# Patient Record
Sex: Male | Born: 1955
Health system: Southern US, Community
[De-identification: ages and names within clinical notes are randomized; demographics above are authoritative.]

## PROBLEM LIST (undated history)

## (undated) DIAGNOSIS — I10 Essential (primary) hypertension: Secondary | ICD-10-CM

## (undated) DIAGNOSIS — E785 Hyperlipidemia, unspecified: Secondary | ICD-10-CM

## (undated) DIAGNOSIS — H269 Unspecified cataract: Secondary | ICD-10-CM

## (undated) DIAGNOSIS — T7840XA Allergy, unspecified, initial encounter: Secondary | ICD-10-CM

## (undated) DIAGNOSIS — G4730 Sleep apnea, unspecified: Secondary | ICD-10-CM

## (undated) HISTORY — PX: DENTAL SURGERY: SHX609

## (undated) HISTORY — PX: OTHER SURGICAL HISTORY: SHX169

## (undated) HISTORY — DX: Allergy, unspecified, initial encounter: T78.40XA

## (undated) HISTORY — DX: Hyperlipidemia, unspecified: E78.5

## (undated) HISTORY — PX: APPENDECTOMY: SHX54

## (undated) HISTORY — DX: Essential (primary) hypertension: I10

## (undated) HISTORY — PX: COLONOSCOPY: SHX174

## (undated) HISTORY — DX: Unspecified cataract: H26.9

## (undated) HISTORY — DX: Sleep apnea, unspecified: G47.30

---

## 2001-01-05 HISTORY — PX: SINUS IRRIGATION: SHX2411

## 2003-05-20 ENCOUNTER — Emergency Department (HOSPITAL_COMMUNITY): Admission: EM | Admit: 2003-05-20 | Discharge: 2003-05-20 | Payer: Self-pay | Admitting: *Deleted

## 2003-08-19 ENCOUNTER — Emergency Department (HOSPITAL_COMMUNITY): Admission: EM | Admit: 2003-08-19 | Discharge: 2003-08-19 | Payer: Self-pay | Admitting: Family Medicine

## 2003-11-21 ENCOUNTER — Ambulatory Visit: Payer: Self-pay | Admitting: Internal Medicine

## 2004-12-30 ENCOUNTER — Ambulatory Visit: Payer: Self-pay | Admitting: Internal Medicine

## 2005-01-14 ENCOUNTER — Ambulatory Visit: Payer: Self-pay | Admitting: Internal Medicine

## 2005-01-19 ENCOUNTER — Ambulatory Visit: Payer: Self-pay | Admitting: Internal Medicine

## 2005-02-11 ENCOUNTER — Ambulatory Visit: Payer: Self-pay | Admitting: Internal Medicine

## 2005-02-18 ENCOUNTER — Ambulatory Visit: Payer: Self-pay | Admitting: Internal Medicine

## 2005-02-27 ENCOUNTER — Ambulatory Visit: Payer: Self-pay

## 2005-12-01 ENCOUNTER — Ambulatory Visit: Payer: Self-pay | Admitting: Internal Medicine

## 2006-07-29 DIAGNOSIS — E785 Hyperlipidemia, unspecified: Secondary | ICD-10-CM | POA: Insufficient documentation

## 2006-08-27 ENCOUNTER — Ambulatory Visit: Payer: Self-pay | Admitting: Internal Medicine

## 2006-08-27 LAB — CONVERTED CEMR LAB
ALT: 31 units/L (ref 0–53)
AST: 24 units/L (ref 0–37)
Albumin: 4.2 g/dL (ref 3.5–5.2)
Alkaline Phosphatase: 44 units/L (ref 39–117)
BUN: 14 mg/dL (ref 6–23)
Basophils Absolute: 0 10*3/uL (ref 0.0–0.1)
Basophils Relative: 0.3 % (ref 0.0–1.0)
Bilirubin Urine: NEGATIVE
Bilirubin, Direct: 0.1 mg/dL (ref 0.0–0.3)
Blood in Urine, dipstick: NEGATIVE
CO2: 27 meq/L (ref 19–32)
Calcium: 9.1 mg/dL (ref 8.4–10.5)
Chloride: 107 meq/L (ref 96–112)
Cholesterol: 193 mg/dL (ref 0–200)
Creatinine, Ser: 0.9 mg/dL (ref 0.4–1.5)
Eosinophils Absolute: 0.2 10*3/uL (ref 0.0–0.6)
Eosinophils Relative: 4 % (ref 0.0–5.0)
GFR calc Af Amer: 114 mL/min
GFR calc non Af Amer: 94 mL/min
Glucose, Bld: 96 mg/dL (ref 70–99)
Glucose, Urine, Semiquant: NEGATIVE
HCT: 41 % (ref 39.0–52.0)
HDL: 59.4 mg/dL (ref >39.0)
Hemoglobin: 14.3 g/dL (ref 13.0–17.0)
Ketones, urine, test strip: NEGATIVE
LDL Cholesterol: 122 mg/dL — ABNORMAL HIGH (ref 0–99)
Lymphocytes Relative: 27.6 % (ref 12.0–46.0)
MCHC: 34.9 g/dL (ref 30.0–36.0)
MCV: 92.8 fL (ref 78.0–100.0)
Monocytes Absolute: 0.5 10*3/uL (ref 0.2–0.7)
Monocytes Relative: 9.8 % (ref 3.0–11.0)
Neutro Abs: 2.9 10*3/uL (ref 1.4–7.7)
Neutrophils Relative %: 58.3 % (ref 43.0–77.0)
Nitrite: NEGATIVE
PSA: 1.19 ng/mL (ref 0.10–4.00)
Platelets: 239 10*3/uL (ref 150–400)
Potassium: 4.7 meq/L (ref 3.5–5.1)
Protein, U semiquant: NEGATIVE
RBC: 4.42 M/uL (ref 4.22–5.81)
RDW: 12.8 % (ref 11.5–14.6)
Sodium: 142 meq/L (ref 135–145)
Specific Gravity, Urine: 1.025
TSH: 0.77 microintl units/mL (ref 0.35–5.50)
Total Bilirubin: 1.1 mg/dL (ref 0.3–1.2)
Total CHOL/HDL Ratio: 3.2
Total Protein: 6.5 g/dL (ref 6.0–8.3)
Triglycerides: 56 mg/dL (ref 0–149)
Urobilinogen, UA: 0.2
VLDL: 11 mg/dL (ref 0–40)
WBC Urine, dipstick: NEGATIVE
WBC: 5 10*3/uL (ref 4.5–10.5)
pH: 6.5

## 2006-09-03 ENCOUNTER — Ambulatory Visit: Payer: Self-pay | Admitting: Internal Medicine

## 2006-12-07 ENCOUNTER — Ambulatory Visit: Payer: Self-pay | Admitting: Internal Medicine

## 2007-03-25 ENCOUNTER — Ambulatory Visit: Payer: Self-pay | Admitting: Internal Medicine

## 2007-03-25 DIAGNOSIS — I10 Essential (primary) hypertension: Secondary | ICD-10-CM | POA: Insufficient documentation

## 2007-04-27 ENCOUNTER — Ambulatory Visit: Payer: Self-pay | Admitting: Internal Medicine

## 2007-05-18 ENCOUNTER — Ambulatory Visit: Payer: Self-pay | Admitting: Internal Medicine

## 2007-05-18 LAB — CONVERTED CEMR LAB
AST: 27 units/L (ref 0–37)
Albumin: 4.1 g/dL (ref 3.5–5.2)
BUN: 13 mg/dL (ref 6–23)
Calcium: 9.3 mg/dL (ref 8.4–10.5)
Cholesterol: 152 mg/dL (ref 0–200)
Creatinine, Ser: 0.9 mg/dL (ref 0.4–1.5)
GFR calc Af Amer: 114 mL/min
GFR calc non Af Amer: 94 mL/min
Glucose, Bld: 91 mg/dL (ref 70–99)
HDL: 47.8 mg/dL (ref >39.0)
Total CHOL/HDL Ratio: 3.2
VLDL: 15 mg/dL (ref 0–40)

## 2007-05-25 ENCOUNTER — Ambulatory Visit: Payer: Self-pay | Admitting: Internal Medicine

## 2007-12-07 ENCOUNTER — Ambulatory Visit: Payer: Self-pay | Admitting: Internal Medicine

## 2007-12-07 LAB — CONVERTED CEMR LAB
AST: 25 units/L (ref 0–37)
Alkaline Phosphatase: 40 units/L (ref 39–117)
Bilirubin Urine: NEGATIVE
Blood in Urine, dipstick: NEGATIVE
CO2: 29 meq/L (ref 19–32)
Chloride: 110 meq/L (ref 96–112)
Cholesterol: 130 mg/dL (ref 0–200)
Creatinine, Ser: 1 mg/dL (ref 0.4–1.5)
Eosinophils Absolute: 0.3 10*3/uL (ref 0.0–0.7)
Eosinophils Relative: 4.6 % (ref 0.0–5.0)
GFR calc non Af Amer: 83 mL/min
Glucose, Urine, Semiquant: NEGATIVE
Hemoglobin: 14 g/dL (ref 13.0–17.0)
LDL Cholesterol: 70 mg/dL (ref 0–99)
MCHC: 34.3 g/dL (ref 30.0–36.0)
MCV: 93.4 fL (ref 78.0–100.0)
Monocytes Absolute: 0.7 10*3/uL (ref 0.1–1.0)
Monocytes Relative: 10.9 % (ref 3.0–12.0)
Neutrophils Relative %: 55.3 % (ref 43.0–77.0)
Platelets: 220 10*3/uL (ref 150–400)
Potassium: 4.9 meq/L (ref 3.5–5.1)
Protein, U semiquant: NEGATIVE
RBC: 4.37 M/uL (ref 4.22–5.81)
RDW: 12.7 % (ref 11.5–14.6)
Total Bilirubin: 0.8 mg/dL (ref 0.3–1.2)
Total Protein: 6.7 g/dL (ref 6.0–8.3)
Triglycerides: 73 mg/dL (ref 0–149)
VLDL: 15 mg/dL (ref 0–40)
pH: 6

## 2007-12-20 ENCOUNTER — Ambulatory Visit: Payer: Self-pay | Admitting: Internal Medicine

## 2008-01-12 ENCOUNTER — Encounter: Payer: Self-pay | Admitting: Gastroenterology

## 2008-04-25 ENCOUNTER — Ambulatory Visit: Payer: Self-pay | Admitting: Gastroenterology

## 2008-06-07 ENCOUNTER — Ambulatory Visit: Payer: Self-pay | Admitting: Internal Medicine

## 2008-06-07 LAB — CONVERTED CEMR LAB
ALT: 31 units/L (ref 0–53)
AST: 23 units/L (ref 0–37)
Albumin: 4 g/dL (ref 3.5–5.2)
Alkaline Phosphatase: 42 units/L (ref 39–117)
BUN: 13 mg/dL (ref 6–23)
Bilirubin, Direct: 0.1 mg/dL (ref 0.0–0.3)
CO2: 28 meq/L (ref 19–32)
Calcium: 8.9 mg/dL (ref 8.4–10.5)
Chloride: 108 meq/L (ref 96–112)
Cholesterol: 136 mg/dL (ref 0–200)
Creatinine, Ser: 0.9 mg/dL (ref 0.4–1.5)
GFR calc non Af Amer: 93.32 mL/min (ref >60)
Glucose, Bld: 90 mg/dL (ref 70–99)
HDL: 45 mg/dL (ref >39.00)
LDL Cholesterol: 77 mg/dL (ref 0–99)
Potassium: 4.4 meq/L (ref 3.5–5.1)
Sodium: 141 meq/L (ref 135–145)
Total Bilirubin: 1.1 mg/dL (ref 0.3–1.2)
Total CHOL/HDL Ratio: 3
Total Protein: 6.7 g/dL (ref 6.0–8.3)
Triglycerides: 71 mg/dL (ref 0.0–149.0)
VLDL: 14.2 mg/dL (ref 0.0–40.0)

## 2008-06-18 ENCOUNTER — Telehealth: Payer: Self-pay | Admitting: Gastroenterology

## 2008-06-22 ENCOUNTER — Encounter: Payer: Self-pay | Admitting: Gastroenterology

## 2008-06-22 ENCOUNTER — Ambulatory Visit: Payer: Self-pay | Admitting: Gastroenterology

## 2008-06-22 LAB — HM COLONOSCOPY

## 2008-06-30 ENCOUNTER — Encounter: Payer: Self-pay | Admitting: Gastroenterology

## 2008-11-05 ENCOUNTER — Ambulatory Visit: Payer: Self-pay | Admitting: Internal Medicine

## 2009-02-05 ENCOUNTER — Ambulatory Visit: Payer: Self-pay | Admitting: Internal Medicine

## 2009-02-06 LAB — CONVERTED CEMR LAB
ALT: 28 units/L (ref 0–53)
Bilirubin, Direct: 0.2 mg/dL (ref 0.0–0.3)
Calcium: 9.4 mg/dL (ref 8.4–10.5)
Chloride: 107 meq/L (ref 96–112)
HDL: 60 mg/dL (ref >39.00)
Potassium: 4.5 meq/L (ref 3.5–5.1)
Sodium: 142 meq/L (ref 135–145)
Total CHOL/HDL Ratio: 3
Triglycerides: 124 mg/dL (ref 0.0–149.0)
VLDL: 24.8 mg/dL (ref 0.0–40.0)

## 2009-06-21 ENCOUNTER — Ambulatory Visit: Payer: Self-pay | Admitting: Family Medicine

## 2009-06-21 DIAGNOSIS — J3089 Other allergic rhinitis: Secondary | ICD-10-CM

## 2009-06-21 DIAGNOSIS — J302 Other seasonal allergic rhinitis: Secondary | ICD-10-CM | POA: Insufficient documentation

## 2009-06-25 ENCOUNTER — Ambulatory Visit: Payer: Self-pay | Admitting: Family Medicine

## 2009-06-25 DIAGNOSIS — J019 Acute sinusitis, unspecified: Secondary | ICD-10-CM | POA: Insufficient documentation

## 2009-07-16 ENCOUNTER — Ambulatory Visit: Payer: Self-pay | Admitting: Internal Medicine

## 2009-07-18 ENCOUNTER — Telehealth: Payer: Self-pay | Admitting: Internal Medicine

## 2009-07-22 ENCOUNTER — Telehealth: Payer: Self-pay | Admitting: Internal Medicine

## 2009-10-17 ENCOUNTER — Ambulatory Visit: Payer: Self-pay | Admitting: Internal Medicine

## 2010-02-04 NOTE — Assessment & Plan Note (Signed)
Summary: flu shot/cjr  Nurse Visit   Allergies: 1)  * No Known Drug Allergies Group  Orders Added: 1)  Admin 1st Vaccine [90471] 2)  Flu Vaccine 59yrs + [09811]   Flu Vaccine Consent Questions     Do you have a history of severe allergic reactions to this vaccine? no    Any prior history of allergic reactions to egg and/or gelatin? no    Do you have a sensitivity to the preservative Thimersol? no    Do you have a past history of Guillan-Barre Syndrome? no    Do you currently have an acute febrile illness? no    Have you ever had a severe reaction to latex? no    Vaccine information given and explained to patient? yes    Are you currently pregnant? no    Lot Number:[id number]   Exp Date:07/05/2010   Site Given  Left Deltoid IM1. Romualdo Bolk, CMA Duncan Dull)  October 17, 2009 3:43 PM

## 2010-02-04 NOTE — Progress Notes (Signed)
Summary: please advise of sinus infection.  Phone Note Call from Patient Call back at [phone]   Caller: Patient--live call Summary of Call: On day #3 of a 10 day regimen for sinus infection. Not any better. please return call. Initial call taken by: Warnell Forester,  July 18, 2009 12:21 PM  Follow-up for Phone Call        continue antibiotic start saline nasal spray if not already doing so.  should be at least 75% better by monday Follow-up by: Birdie Sons MD,  July 18, 2009 2:24 PM  Additional Follow-up for Phone Call Additional follow up Details #1::        Notified pt. Additional Follow-up by: Lynann Beaver CMA,  July 18, 2009 2:32 PM

## 2010-02-04 NOTE — Assessment & Plan Note (Signed)
Summary: requesting shot/ccm   Vital Signs:  Patient profile:   55 year old male Temp:     97.9 degrees F oral BP sitting:   122 / 84  (left arm) Cuff size:   regular  Vitals Entered By: Sid Falcon LPN (June 25, 2009 8:31 AM) CC: flu like symptoms, weak, headache, sinus pressure   History of Present Illness: 3 weeks of nasal congestion.  Some yellow tinged mucus.  Malaise.  Intermittent sore throat. On Flonase and claritan without much relief.  Some bloody nasal discharge.  Mild headaces.  Facial pain-maxillary and frontal.  Initially thought this was all allergy and now feels this may be more infection related.  No hx of grass pollen/summer allergies.  Does have indoor pets but these are not new.   Allergies: 1)  * No Known Drug Allergies Group  Past History:  Past Medical History: Last updated: 06/21/2009 Hyperlipidemia Hypertension Allergic rhinitis  Review of Systems      See HPI  Physical Exam  General:  Well-developed,well-nourished,in no acute distress; alert,appropriate and cooperative throughout examination Ears:  External ear exam shows no significant lesions or deformities.  Otoscopic examination reveals clear canals, tympanic membranes are intact bilaterally without bulging, retraction, inflammation or discharge. Hearing is grossly normal bilaterally. Nose:  External nasal examination shows no deformity or inflammation. Nasal mucosa are pink and moist without lesions or exudates. Mouth:  Oral mucosa and oropharynx without lesions or exudates.  Teeth in good repair. Neck:  No deformities, masses, or tenderness noted. Lungs:  Normal respiratory effort, chest expands symmetrically. Lungs are clear to auscultation, no crackles or wheezes. Heart:  Normal rate and regular rhythm. S1 and S2 normal without gallop, murmur, click, rub or other extra sounds.   Impression & Recommendations:  Problem # 1:  SINUSITIS, ACUTE (ICD-461.9) Add antibiotic.  Cont Flonase.   touch base in 2 weeks if no better. His updated medication list for this problem includes:    Flonase 50 Mcg/act Susp (Fluticasone propionate) ..... Uad    Azithromycin 250 Mg Tabs (Azithromycin) .[name] Kitchen... 2 by mouth today then one by mouth once daily for 4 days.  Complete Medication List: 1)  Simvastatin 80 Mg Tabs (Simvastatin) .... Take 1 tablet by mouth once a day or as directed 2)  Cholest Off Complete 450-75 Mg Tabs (Plant sterol stanol-pantethine) 3)  Multivitamins Tabs (Multiple vitamin) .... Once daily 4)  B Complex Tabs (B complex vitamins) .... Once daily 5)  Silymarin Caps (Milk thistle-turmeric) .... Once daily 6)  Omega 3 340 Mg Cpdr (Omega-3 fatty acids) .... Once daily 7)  Lutein 6 Mg Caps (Lutein) .... Once daily 8)  Selenium 50 Mcg Tabs (Selenium) .... Once daily 9)  IT sales professional Sinus Formula  .... Once daily 10)  Nettles  .... Qd 11)  Glucosamine-chondroitin 500-400 Mg Caps (Glucosamine-chondroitin) .... Once daily 12)  Cinnamon 500 Mg Caps (Cinnamon) .... Once daily 13)  Redoxx  .[name] Kitchen.. 1-2 once daily 14)  Tea Extract  .... Once daily 15)  Quercitin Powd (Quercetin) .... Once daily 16)  Co Q-10 150 Mg Caps (Coenzyme q10) .... Once daily 17)  Losartan Potassium 100 Mg Tabs (Losartan potassium) .... Take 1 tablet by mouth once a day or as idrected 18)  Flonase 50 Mcg/act Susp (Fluticasone propionate) .... Uad 19)  Azithromycin 250 Mg Tabs (Azithromycin) .... 2 by mouth today then one by mouth once daily for 4 days.  Patient Instructions: 1)  Acute sinusitis symptoms for less than 10  days are not helped by antibiotics. Use warm moist compresses, and over the counter decongestants( only as directed). Call if no improvement in 5-7 days, sooner if increasing pain, fever, or new symptoms.  Prescriptions: AZITHROMYCIN 250 MG TABS (AZITHROMYCIN) 2 by mouth today then one by mouth once daily for 4 days.  #6 x 0   Entered and Authorized by:   Evelena Peat MD   Signed by:   Evelena Peat MD on 06/25/2009   Method used:   Electronically to        [facility]. [phone]* (retail)       [phone] [address]       [place], [place]  [address]       Ph: [phone]       Fax: [phone]   RxID:   [id number]

## 2010-02-04 NOTE — Progress Notes (Signed)
Summary: Pt wants to get a referral to ENT  Phone Note Call from Patient Call back at Home Phone [phone]   Caller: Patient Summary of Call: Pt called and said that he is wanting to consider getting an ENT consult for congestion, nasal congestion and fatigue.  Pls call.   Initial call taken by: Lucy Antigua,  July 22, 2009 3:06 PM  Follow-up for Phone Call        ok Follow-up by: Birdie Sons MD,  July 23, 2009 2:12 PM     Appended Document: Pt wants to get a referral to ENT p[t informed

## 2010-02-04 NOTE — Assessment & Plan Note (Signed)
Summary: ?SINUS INF/UPPER RESP/CJR   Vital Signs:  Patient profile:   56 year old male Height:      67 inches (170.18 cm) Weight:      194.31 pounds (88.32 kg) Temp:     98.3 degrees F (36.83 degrees C) oral Pulse rate:   74 / minute BP sitting:   112 / 84  (left arm) Cuff size:   regular  Vitals Entered By: Josph Macho RMA (July 16, 2009 8:05 AM) CC: Possible sinus or upper resp infection X1 week/ CF Is Patient Diabetic? No   CC:  Possible sinus or upper resp infection X1 week/ CF.  History of Present Illness: 10 day hx of progressive sinus infection has had chills, no fever, night sweats sinus headaces and upper teeth hurt no real cough hx of sinus infection---see notes in June  Current Medications (verified): 1)  Simvastatin 80 Mg Tabs (Simvastatin) .... Take 1 Tablet By Mouth Once A Day or As Directed 2)  Cholest Off Complete 450-75 Mg  Tabs (Plant Sterol Stanol-Pantethine) 3)  Multivitamins  Tabs (Multiple Vitamin) .... Once Daily 4)  B Complex  Tabs (B Complex Vitamins) .... Once Daily 5)  Silymarin  Caps (Milk Thistle-Turmeric) .... Once Daily 6)  Omega 3 340 Mg Cpdr (Omega-3 Fatty Acids) .... Once Daily 7)  Lutein 6 Mg Caps (Lutein) .... Once Daily 8)  Selenium 50 Mcg Tabs (Selenium) .... Once Daily 9)  IT sales professional Sinus Formula .... Once Daily 10)  Nettles .... Qd 11)  Glucosamine-Chondroitin 500-400 Mg Caps (Glucosamine-Chondroitin) .... Once Daily 12)  Cinnamon 500 Mg Caps (Cinnamon) .... Once Daily 13)  Redoxx .[name] Kitchen.. 1-2 Once Daily 14)  Tea Extract .... Once Daily 15)  Quercitin  Powd (Quercetin) .... Once Daily 16)  Co Q-10 150 Mg Caps (Coenzyme Q10) .... Once Daily 17)  Losartan Potassium 100 Mg Tabs (Losartan Potassium) .... Take 1 Tablet By Mouth Once A Day or As Idrected 18)  Flonase 50 Mcg/act Susp (Fluticasone Propionate) .... As Needed  Allergies (verified): 1)  * No Known Drug Allergies Group  Physical Exam  General:   Well-developed,well-nourished,in no acute distress; alert,appropriate and cooperative throughout examination Head:  normocephalic and atraumatic.   Eyes:  pupils equal and pupils round.   Nose:  nasal mucosal erythema Neck:  No deformities, masses, or tenderness noted. Chest Wall:  No deformities, masses, tenderness or gynecomastia noted. Lungs:  normal respiratory effort and no intercostal retractions.     Impression & Recommendations:  Problem # 1:  SINUSITIS, ACUTE (ICD-461.9) continue current medications  His updated medication list for this problem includes:    Flonase 50 Mcg/act Susp (Fluticasone propionate) .[name] Kitchen... As needed    Doxycycline Hyclate 100 Mg Caps (Doxycycline hyclate) .[name] Kitchen... Take 1 tab twice a day saline nasal spray as needed   Complete Medication List: 1)  Simvastatin 80 Mg Tabs (Simvastatin) .... Take 1 tablet by mouth once a day or as directed 2)  Cholest Off Complete 450-75 Mg Tabs (Plant sterol stanol-pantethine) 3)  Multivitamins Tabs (Multiple vitamin) .... Once daily 4)  B Complex Tabs (B complex vitamins) .... Once daily 5)  Silymarin Caps (Milk thistle-turmeric) .... Once daily 6)  Omega 3 340 Mg Cpdr (Omega-3 fatty acids) .... Once daily 7)  Lutein 6 Mg Caps (Lutein) .... Once daily 8)  Selenium 50 Mcg Tabs (Selenium) .... Once daily 9)  IT sales professional Sinus Formula  .... Once daily 10)  Nettles  .... Qd 11)  Glucosamine-chondroitin 500-400 Mg  Caps (Glucosamine-chondroitin) .... Once daily 12)  Cinnamon 500 Mg Caps (Cinnamon) .... Once daily 13)  Redoxx  .[name] Kitchen.. 1-2 once daily 14)  Tea Extract  .... Once daily 15)  Quercitin Powd (Quercetin) .... Once daily 16)  Co Q-10 150 Mg Caps (Coenzyme q10) .... Once daily 17)  Losartan Potassium 100 Mg Tabs (Losartan potassium) .... Take 1 tablet by mouth once a day or as idrected 18)  Flonase 50 Mcg/act Susp (Fluticasone propionate) .... As needed 19)  Doxycycline Hyclate 100 Mg Caps (Doxycycline hyclate) .... Take  1 tab twice a day Prescriptions: DOXYCYCLINE HYCLATE 100 MG CAPS (DOXYCYCLINE HYCLATE) Take 1 tab twice a day  #20 x 0   Entered and Authorized by:   Birdie Sons MD   Signed by:   Birdie Sons MD on 07/16/2009   Method used:   Electronically to        [facility]. [phone]* (retail)       [phone] [address]       [place], [place]  [address]       Ph: [phone]       Fax: [phone]   RxID:   [id number]

## 2010-02-04 NOTE — Assessment & Plan Note (Signed)
Summary: ?sinus inf/njr   Vital Signs:  Patient profile:   55 year old male Weight:      199 pounds Temp:     98.3 degrees F 110oral Resp:     70 per minute BP sitting:   110 / 70  (left arm) Cuff size:   regular  Vitals Entered By: Kern Reap CMA Duncan Dull) (June 21, 2009 11:37 AM) CC: sinus pressure, ST, cough, HA   CC:  sinus pressure, ST, cough, and HA.  History of Present Illness: Scott Neal is a 55 year old, married man nonsmoker comes in today for evaluation of head congestion, postnasal drip, slight cough.  No wheezing.  He said a history of allergic rhinitis is typically worse in the spring.  He takes no medication for his allergies.  He states recently the protesting the arabs , by open the windows and are not turning on air conditioner.  they have a dog, and a cat, but the cat does not sleep in their bedroom. environmental review of systems otherwise negative.  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 No apparent alopecia or balding. Eyes:  No corneal or conjunctival inflammation noted. EOMI. Perrla. Funduscopic exam benign, without hemorrhages, exudates or papilledema. Vision grossly normal. Ears:  External ear exam shows no significant lesions or deformities.  Otoscopic examination reveals clear canals, tympanic membranes are intact bilaterally without bulging, retraction, inflammation or discharge. Hearing is grossly normal bilaterally. Nose:  septum is slightly deviated to the right, but not significant 3+ nasal  edema Mouth:  Oral mucosa and oropharynx without lesions or exudates.  Teeth in good repair. Neck:  No deformities, masses, or tenderness noted. Chest Wall:  No deformities, masses, tenderness or gynecomastia noted. Lungs:  Normal respiratory effort, chest expands symmetrically. Lungs are clear to auscultation, no crackles or wheezes.   Problems:  Medical Problems Added: 1)  Dx of Allergic Rhinitis  (ICD-477.9)  Impression & Recommendations:  Problem # 1:  ALLERGIC RHINITIS (ICD-477.9) Assessment Deteriorated  His updated medication list for this problem includes:    Flonase 50 Mcg/act Susp (Fluticasone propionate) ..... Uad  Complete Medication List: 1)  Simvastatin 80 Mg Tabs (Simvastatin) .... Take 1 tablet by mouth once a day or as directed 2)  Cholest Off Complete 450-75 Mg Tabs (Plant sterol stanol-pantethine) 3)  Multivitamins Tabs (Multiple vitamin) .... Once daily 4)  B Complex Tabs (B complex vitamins) .... Once daily 5)  Silymarin Caps (Milk thistle-turmeric) .... Once daily 6)  Omega 3 340 Mg Cpdr (Omega-3 fatty acids) .... Once daily 7)  Lutein 6 Mg Caps (Lutein) .... Once daily 8)  Selenium 50 Mcg Tabs (Selenium) .... Once daily 9)  IT sales professional Sinus Formula  .... Once daily 10)  Nettles  .... Qd 11)  Glucosamine-chondroitin 500-400 Mg Caps (Glucosamine-chondroitin) .... Once daily 12)  Cinnamon 500 Mg Caps (Cinnamon) .... Once daily 13)  Redoxx  .[name] Kitchen.. 1-2 once daily 14)  Tea Extract  .... Once daily 15)  Quercitin Powd (Quercetin) .... Once daily 16)  Co Q-10 150 Mg Caps (Coenzyme q10) .... Once daily 17)  Losartan Potassium 100 Mg Tabs (Losartan  potassium) .... Take 1 tablet by mouth once a day or as idrected 18)  Flonase 50 Mcg/act Susp (Fluticasone propionate) .... Uad  Patient Instructions: 1)  take plain Claritin in the morning or plain Zyrtec at bedtime.  Also, one shot of the steroid nasal spray up each nostril at bedtime.  Return  p.r.n. Prescriptions: FLONASE 50 MCG/ACT SUSP (FLUTICASONE PROPIONATE) UAD  #1 x 4   Entered and Authorized by:   Roderick Pee MD   Signed by:   Roderick Pee MD on 06/21/2009   Method used:   Electronically to        [facility]. [phone]* (retail)       [phone] [address]       [place], [place]  [address]       Ph: [phone]       Fax: [phone]   RxID:   [id number]

## 2010-02-04 NOTE — Assessment & Plan Note (Signed)
Summary: med check//ccm   Vital Signs:  Patient profile:   55 year old male Height:      67 inches Weight:      199 pounds BMI:     31.28 Temp:     98 degrees F Pulse rate:   72 / minute Resp:     12 per minute BP sitting:   120 / 74  (left arm)  Vitals Entered By: Gladis Riffle, RN (February 05, 2009 11:06 AM) CC: med review Is Patient Diabetic? No   CC:  med review.  History of Present Illness:  Follow-Up Visit      This is a 55 year old man who presents for Follow-up visit.  The patient denies chest pain and palpitations.  Since the last visit the patient notes no new problems or concerns.  The patient reports taking meds as prescribed.  When questioned about possible medication side effects, the patient notes none.  exercising more and eating less.   All other systems reviewed and were negative   Preventive Screening-Counseling & Management  Alcohol-Tobacco     Smoking Status: quit     Year Quit: 1997     Passive Smoke Exposure: no  Current Medications (verified): 1)  Simvastatin 80 Mg Tabs (Simvastatin) .... Take 1 Tablet By Mouth Once A Day or As Directed 2)  Benicar 40 Mg  Tabs (Olmesartan Medoxomil) .[name] Kitchen.. 1 By Mouth Once Daily or As Directed 3)  Cholest Off Complete 450-75 Mg  Tabs (Plant Sterol Stanol-Pantethine) 4)  Multivitamins  Tabs (Multiple Vitamin) .... Once Daily 5)  B Complex  Tabs (B Complex Vitamins) .... Once Daily 6)  Silymarin  Caps (Milk Thistle-Turmeric) .... Once Daily 7)  Omega 3 340 Mg Cpdr (Omega-3 Fatty Acids) .... Once Daily 8)  Lutein 6 Mg Caps (Lutein) .... Once Daily 9)  Selenium 50 Mcg Tabs (Selenium) .... Once Daily 10)  IT sales professional Sinus Formula .... Once Daily 11)  Nettles .... Qd 12)  Glucosamine-Chondroitin 500-400 Mg Caps (Glucosamine-Chondroitin) .... Once Daily 13)  Cinnamon 500 Mg Caps (Cinnamon) .... Once Daily 14)  Redoxx .[name] Kitchen.. 1-2 Once Daily 15)  Tea Extract .... Once Daily 16)  Quercitin  Powd (Quercetin) .... Once  Daily 17)  Co Q-10 150 Mg Caps (Coenzyme Q10) .... Once Daily  Allergies: 1)  * No Known Drug Allergies Group  Comments:  Nurse/Medical Assistant: med review  The patient's medications and allergies were reviewed with the patient and were updated in the Medication and Allergy Lists. 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   Eyes:  pupils equal and pupils round.   Ears:  R ear normal and L ear normal.   Neck:  No deformities, masses, or tenderness noted. Chest Wall:  No deformities, masses, tenderness or  Lungs:  normal respiratory effort, no intercostal retractions, and no accessory muscle use.  Heart:  Normal rate and regular rhythm. S1 and S2 normal without gallop, murmur, click, rub or other extra sounds. Abdomen:  Bowel sounds positive,abdomen soft and non-tender without masses, organomegaly or hernias noted. Msk:  No deformity or scoliosis noted of thoracic or lumbar spine.   Pulses:  R radial normal and L radial normal.   Neurologic:  cranial nerves II-XII intact and gait normal.     Impression & Recommendations:  Problem # 1:  HYPERTENSION (ICD-401.9)  controlled continue current medications   The following medications were removed from the medication list:    Benicar 40 Mg Tabs (Olmesartan medoxomil) .[name] Kitchen... 1 by mouth once daily or as directed His updated medication list for this problem includes:    Losartan Potassium 100 Mg Tabs (Losartan potassium) .[name] Kitchen... Take 1 tablet by mouth once a day or as idrected  Orders: Venipuncture (16[address]) TLB-BMP (Basic Metabolic Panel-BMET) (80048-METABOL)  Problem # 2:  HYPERLIPIDEMIA (ICD-272.4) check labs today His updated medication list for this problem includes:    Simvastatin 80 Mg Tabs (Simvastatin) .[name] Kitchen... Take 1 tablet by mouth once a day or as directed  Labs Reviewed: SGOT: 23 (06/07/2008)   SGPT: 31 (06/07/2008)   HDL:45.00 (06/07/2008), 45.3 (12/07/2007)  LDL:77 (06/07/2008), 70 (12/07/2007)  Chol:136 (06/07/2008), 130 (12/07/2007)  Trig:71.0 (06/07/2008), 73 (12/07/2007)  Orders: Venipuncture (60454) TLB-Lipid Panel (80061-LIPID) TLB-Hepatic/Liver Function Pnl (80076-HEPATIC)  Complete Medication List: 1)  Simvastatin 80 Mg Tabs (Simvastatin) .... Take 1 tablet by mouth once a day or as directed 2)  Cholest Off Complete 450-75 Mg Tabs (Plant sterol stanol-pantethine) 3)  Multivitamins Tabs (Multiple vitamin) .... Once daily 4)  B Complex Tabs (B complex vitamins) .... Once daily 5)  Silymarin Caps (Milk thistle-turmeric) .... Once daily 6)  Omega 3 340 Mg Cpdr (Omega-3 fatty acids) .... Once daily 7)  Lutein 6 Mg Caps (Lutein) .... Once daily 8)  Selenium 50 Mcg Tabs (Selenium) .... Once daily 9)  IT sales professional Sinus Formula  .... Once daily 10)  Nettles  .... Qd 11)  Glucosamine-chondroitin 500-400 Mg Caps (Glucosamine-chondroitin) .... Once daily 12)  Cinnamon 500 Mg Caps (Cinnamon) .... Once daily 13)  Redoxx  .[name] Kitchen.. 1-2 once daily 14)  Tea Extract  .... Once daily 15)  Quercitin Powd (Quercetin) .... Once daily 16)  Co Q-10 150 Mg Caps (Coenzyme q10) .... Once daily 17)  Losartan Potassium 100 Mg Tabs (Losartan  potassium) .... Take 1 tablet by mouth once a day or as idrected  Patient Instructions: 1)  . Prescriptions: SIMVASTATIN 80 MG TABS (SIMVASTATIN) Take 1 tablet by mouth once a day or as directed  #30 x 6   Entered by:   Gladis Riffle, RN   Authorized by:   Birdie Sons MD   Signed by:   Gladis Riffle, RN on 02/06/2009   Method used:   Electronically to        [facility]. [phone]* (retail)       [phone] [address]       [place], [place]  [address]       Ph: [phone]       Fax: [phone]   RxID:   [id number] POTASSIUM 100 MG TABS (LOSARTAN POTASSIUM) Take 1 tablet by mouth once a day or as idrected  #30 x 11   Entered and Authorized by:   Birdie Sons MD   Signed by:   Birdie Sons MD on 02/05/2009   Method  used:   Tax adviser to        [facility]. [phone]* (retail)       [phone] [address]       [place], [place]  [address]       Ph: [phone]       Fax: [phone]   RxID:   [id number]

## 2010-02-06 ENCOUNTER — Other Ambulatory Visit: Payer: Self-pay | Admitting: Internal Medicine

## 2010-02-06 DIAGNOSIS — I10 Essential (primary) hypertension: Secondary | ICD-10-CM

## 2010-02-10 ENCOUNTER — Other Ambulatory Visit: Payer: Self-pay | Admitting: *Deleted

## 2010-03-05 ENCOUNTER — Other Ambulatory Visit: Payer: 59 | Admitting: Internal Medicine

## 2010-03-05 ENCOUNTER — Other Ambulatory Visit: Payer: Self-pay | Admitting: Internal Medicine

## 2010-03-05 DIAGNOSIS — Z0000 Encounter for general adult medical examination without abnormal findings: Secondary | ICD-10-CM

## 2010-03-05 DIAGNOSIS — Z125 Encounter for screening for malignant neoplasm of prostate: Secondary | ICD-10-CM

## 2010-03-05 LAB — POCT URINALYSIS DIPSTICK
Blood, UA: NEGATIVE
Glucose, UA: NEGATIVE
Ketones, UA: NEGATIVE
Protein, UA: NEGATIVE
Spec Grav, UA: 1.025
Urobilinogen, UA: 0.2
pH, UA: 6

## 2010-03-05 LAB — BASIC METABOLIC PANEL
BUN: 16 mg/dL (ref 6–23)
Calcium: 9.1 mg/dL (ref 8.4–10.5)
Creatinine, Ser: 0.9 mg/dL (ref 0.4–1.5)
Potassium: 4.6 mEq/L (ref 3.5–5.1)
Sodium: 140 mEq/L (ref 135–145)

## 2010-03-05 LAB — CBC WITH DIFFERENTIAL/PLATELET
Eosinophils Relative: 2.6 % (ref 0.0–5.0)
MCHC: 34.4 g/dL (ref 30.0–36.0)
Monocytes Absolute: 0.6 10*3/uL (ref 0.1–1.0)
Monocytes Relative: 11.4 % (ref 3.0–12.0)
Neutro Abs: 3.3 10*3/uL (ref 1.4–7.7)
Neutrophils Relative %: 58.5 % (ref 43.0–77.0)
Platelets: 197 10*3/uL (ref 150.0–400.0)
RBC: 4.23 Mil/uL (ref 4.22–5.81)
RDW: 13.5 % (ref 11.5–14.6)
WBC: 5.6 10*3/uL (ref 4.5–10.5)

## 2010-03-05 LAB — PSA: PSA: 2.56 ng/mL (ref 0.10–4.00)

## 2010-03-05 LAB — HEPATIC FUNCTION PANEL
Alkaline Phosphatase: 44 U/L (ref 39–117)
Bilirubin, Direct: 0.2 mg/dL (ref 0.0–0.3)
Total Bilirubin: 1.1 mg/dL (ref 0.3–1.2)

## 2010-03-05 LAB — LIPID PANEL
HDL: 56.8 mg/dL (ref >39.00)
Triglycerides: 61 mg/dL (ref 0.0–149.0)

## 2010-03-08 ENCOUNTER — Encounter: Payer: Self-pay | Admitting: Internal Medicine

## 2010-03-12 ENCOUNTER — Ambulatory Visit (INDEPENDENT_AMBULATORY_CARE_PROVIDER_SITE_OTHER): Payer: 59 | Admitting: Internal Medicine

## 2010-03-12 ENCOUNTER — Encounter: Payer: Self-pay | Admitting: Internal Medicine

## 2010-03-12 DIAGNOSIS — Z0000 Encounter for general adult medical examination without abnormal findings: Secondary | ICD-10-CM

## 2010-03-12 DIAGNOSIS — R972 Elevated prostate specific antigen [PSA]: Secondary | ICD-10-CM

## 2010-03-12 NOTE — Progress Notes (Signed)
  Subjective:    Patient ID: Scott Neal, male    DOB: 31-May-1955, 55 y.o.   MRN: [id number]  HPI  cpx  Past Medical History  Diagnosis Date  . Hyperlipidemia   . Hypertension   . Allergy    Past Surgical History  Procedure Date  . Appendectomy   . Sinus irrigation 2003    reports that he quit smoking about 13 years ago. He does not have any smokeless tobacco history on file. His alcohol and drug histories not on file. family history includes Diabetes in his brother; GER disease in his mother; Heart attack in his father; Heart disease in his father; Hypertension in his brothers; and Nephrolithiasis in his daughter.    No Known Allergies   Review of Systems  patient denies chest pain, shortness of breath, orthopnea. Denies lower extremity edema, abdominal pain, change in appetite, change in bowel movements. Patient denies rashes, musculoskeletal complaints. No other specific complaints in a complete review of systems.      Objective:   Physical Exam Well-developed male in no acute distress. HEENT exam atraumatic, normocephalic, extraocular muscles are intact. Conjunctivae are pink without exudate. Neck is supple without lymphadenopathy, thyromegaly, jugular venous distention. Chest is clear to auscultation without increased work of breathing. Cardiac exam S1-S2 are regular. The PMI is normal. No significant murmurs or gallops. Abdominal exam active bowel sounds, soft, nontender. No abdominal bruits. Extremities no clubbing cyanosis or edema. Peripheral pulses are normal without bruits. Neurologic exam alert and oriented without any motor or sensory deficits. Rectal exam normal tone prostate normal size without masses or asymmetry.        Assessment & Plan:  Health maint utd  reviewed colonoscopy results.   I've asked the patient to participate in regular exercise. He is already exercising 3 days weekly. I think exercising 5-7 days weekly would improve his body mass index.

## 2010-04-30 ENCOUNTER — Telehealth: Payer: Self-pay

## 2010-04-30 ENCOUNTER — Ambulatory Visit (INDEPENDENT_AMBULATORY_CARE_PROVIDER_SITE_OTHER): Payer: 59 | Admitting: Internal Medicine

## 2010-04-30 ENCOUNTER — Encounter: Payer: Self-pay | Admitting: Internal Medicine

## 2010-04-30 VITALS — BP 108/60 | HR 90 | Temp 98.5°F | Wt 198.0 lb

## 2010-04-30 DIAGNOSIS — R05 Cough: Secondary | ICD-10-CM

## 2010-04-30 DIAGNOSIS — R059 Cough, unspecified: Secondary | ICD-10-CM

## 2010-04-30 DIAGNOSIS — J40 Bronchitis, not specified as acute or chronic: Secondary | ICD-10-CM

## 2010-04-30 MED ORDER — DOXYCYCLINE HYCLATE 100 MG PO TABS: 100.0000 mg | ORAL_TABLET | Freq: Two times a day (BID) | ORAL | Status: DC

## 2010-04-30 NOTE — Telephone Encounter (Signed)
Message copied by Kyung Rudd on Wed Apr 30, 2010 12:54 PM ------      Message from: Letitia Libra, Maisie Fus      Created: Wed Apr 30, 2010 12:36 PM       Please notify flu swab negative. Cell # [phone]

## 2010-04-30 NOTE — Telephone Encounter (Signed)
Pt aware.

## 2010-05-03 ENCOUNTER — Encounter: Payer: Self-pay | Admitting: Internal Medicine

## 2010-05-03 DIAGNOSIS — J40 Bronchitis, not specified as acute or chronic: Secondary | ICD-10-CM | POA: Insufficient documentation

## 2010-05-03 NOTE — Progress Notes (Signed)
  Subjective:    Patient ID: Scott Neal, male    DOB: 1955/06/02, 55 y.o.   MRN: [id number]  HPI Patient presents to clinic for evaluation of cough. Notes seven-day history of cough slightly productive with associated fever and myalgias. Cough is worsening. No shortness of breath or wheezing.No sick exposures. No alleviating or exacerbating factors. No other complaints.  Reviewed past medical history, medications and allergies    Review of Systems see history of present illness     Objective:   Physical Exam    Physical Exam  Vitals reviewed. Constitutional:  appears well-developed and well-nourished. No distress.  HENT:  Head: Normocephalic and atraumatic.  Right Ear: Tympanic membrane, external ear and ear canal normal.  Left Ear: Tympanic membrane, external ear and ear canal normal.  Nose: Nose normal.  Mouth/Throat: Oropharynx is clear and moist. No oropharyngeal exudate.  Eyes: Conjunctivae and EOM are normal. Pupils are equal, round, and reactive to light. Right eye exhibits no discharge. Left eye exhibits no discharge. No scleral icterus.  Neck: Neck supple. No thyromegaly present.  Cardiovascular: Normal rate, regular rhythm and normal heart sounds.  Exam reveals no gallop and no friction rub.   No murmur heard. Pulmonary/Chest: Effort normal and breath sounds normal. No respiratory distress.  has no wheezes.  has no rales.  Lymphadenopathy:   no cervical adenopathy.  Neurological:  is alert.  Skin: Skin is warm and dry.  not diaphoretic.  Psychiatric: normal mood and affect.      Assessment & Plan:

## 2010-05-03 NOTE — Assessment & Plan Note (Signed)
Influenza swab obtained and negative. Begin antibiotic therapy to completion. Followup if no improvement or worsening.

## 2010-08-20 ENCOUNTER — Other Ambulatory Visit (INDEPENDENT_AMBULATORY_CARE_PROVIDER_SITE_OTHER): Payer: 59

## 2010-08-20 DIAGNOSIS — R972 Elevated prostate specific antigen [PSA]: Secondary | ICD-10-CM

## 2010-11-04 ENCOUNTER — Ambulatory Visit (INDEPENDENT_AMBULATORY_CARE_PROVIDER_SITE_OTHER): Payer: 59

## 2010-11-04 DIAGNOSIS — Z23 Encounter for immunization: Secondary | ICD-10-CM

## 2011-02-20 ENCOUNTER — Other Ambulatory Visit: Payer: Self-pay | Admitting: Internal Medicine

## 2011-02-24 ENCOUNTER — Encounter: Payer: Self-pay | Admitting: Family Medicine

## 2011-02-24 ENCOUNTER — Ambulatory Visit (INDEPENDENT_AMBULATORY_CARE_PROVIDER_SITE_OTHER): Payer: 59 | Admitting: Family Medicine

## 2011-02-24 VITALS — BP 106/70 | HR 69 | Temp 98.3°F | Wt 190.0 lb

## 2011-02-24 DIAGNOSIS — M79609 Pain in unspecified limb: Secondary | ICD-10-CM

## 2011-02-24 DIAGNOSIS — M25569 Pain in unspecified knee: Secondary | ICD-10-CM

## 2011-02-24 DIAGNOSIS — M25562 Pain in left knee: Secondary | ICD-10-CM

## 2011-02-24 DIAGNOSIS — M79672 Pain in left foot: Secondary | ICD-10-CM

## 2011-02-24 NOTE — Progress Notes (Signed)
  Subjective:    Patient ID: Scott Neal, male    DOB: Oct 08, 1955, 56 y.o.   MRN: [id number]  HPI Here for 2 pain syndromes. First about 3 months ago he developed a pain behind the left knee which waxes and wanes. 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 No swelling or locking or giving way in the knee. Then about 2 months ago he developed a sharp pain in the left forefoot around the 3rd MTP joint. No redness or swelling. He gets some relief with Advil, and he has started using a number of herbal products with purported anti-inflammatory qualities like turmeric and green tea.   Review of Systems  Constitutional: Negative.   Musculoskeletal: Positive for arthralgias. Negative for joint swelling.       Objective:   Physical Exam  Constitutional: He appears well-developed and well-nourished.  Musculoskeletal:       The left knee is tender in the popliteal space and over the flexor tendons. No masses or joint space tenderness. No crepitus. Full ROM. No swelling.  The left foot is tender in the joint spaces between the 2nd and 3rd MTP joints and between the 3rd and 4th MTPs . No swelling or redness or masses           Assessment & Plan:  He has tendonitis in the knee and a probable Mortons neuroma in the foot. These are related to overuse. I advised him to stop playing hockey for a few weeks to rest these areas, but he is adamant that he will not stop. Advised taking Aleve bid, icing the areas, and wearing supportive shoes. Recheck prn

## 2011-07-02 ENCOUNTER — Encounter: Payer: Self-pay | Admitting: Internal Medicine

## 2011-07-02 ENCOUNTER — Ambulatory Visit (INDEPENDENT_AMBULATORY_CARE_PROVIDER_SITE_OTHER): Payer: 59 | Admitting: Internal Medicine

## 2011-07-02 VITALS — BP 136/82 | Temp 98.3°F | Wt 199.0 lb

## 2011-07-02 DIAGNOSIS — M549 Dorsalgia, unspecified: Secondary | ICD-10-CM

## 2011-07-02 DIAGNOSIS — I10 Essential (primary) hypertension: Secondary | ICD-10-CM

## 2011-07-02 DIAGNOSIS — E785 Hyperlipidemia, unspecified: Secondary | ICD-10-CM

## 2011-07-02 LAB — POCT URINALYSIS DIPSTICK
Blood, UA: NEGATIVE
Ketones, UA: NEGATIVE
Protein, UA: NEGATIVE
Spec Grav, UA: 1.01
Urobilinogen, UA: 0.2
pH, UA: 6

## 2011-07-02 NOTE — Progress Notes (Signed)
  Subjective:    Patient ID: Scott Neal, male    DOB: Jan 11, 1955, 56 y.o.   MRN: [id number]  HPI  56 year old patient who has a history of treated hypertension and dyslipidemia. He is seen today with a chief complaint of low back pain present for 2 days. Last weekend he spent at a spa in [place] and activities included lap  swimming which is not a usual activity. He is quite active physically with ice hockey 3 times per week.  Pain is in the lumbar area and aggravated by movement. No radicular pain. No constitutional complaints. No genitourinary complaints. No prior history of chronic low back pain    Review of Systems  Musculoskeletal: Positive for back pain.       Objective:   Physical Exam  Constitutional: He appears well-developed and well-nourished. No distress.       Mildly overweight Some discomfort with transfer from the sitting position to the examining table  Musculoskeletal:       Right lumbar musculature slightly tense Straight leg testing negative Motor strength normal Reflexes normal          Assessment & Plan:   Low back pain. Suspect musculoligamentous probably overuse injury from over extension during lap swimming. A cortisone injection offered. He wishes to defer for least 24 hours. We'll continue naproxen twice daily. Will call if unimproved

## 2011-07-02 NOTE — Patient Instructions (Addendum)
Naproxen 400- 500 mg twice daily  Most patients with low back pain will improve with time over the next two.  Keep active but avoid any activities that cause pain.  Apply moist heat to the low back area several times daily.  Call or return to clinic prn if these symptoms worsen or fail to improve as anticipated. Back Injury Prevention Back injuries are extremely painful, difficult to heal, and have an effect on everything you do. After suffering one back injury, you are much more likely to experience another later on. It is important to learn how to avoid injuring or re-injuring your back. You can learn proper lifting techniques and the basics of back safety, saving yourself a lot of pain and a lifetime of back problems.   WHY DO BACK INJURIES OCCUR?  The lower part of the back holds most of the body's weight.   Every time you bend over, lift a heavy object, or sit leaning forward, you put stress on your spine.   Over time, the discs between your vertebrae can start to wear out and become damaged.   Repetitive bending and lifting can quickly cause back problems.   Even leaning forward while sitting at a desk or table can eventually cause damage and pain.  The following are contributing factors, and related tips to prevent back injury. POOR PHYSICAL CONDITION, EXTRA WEIGHT, AND INACTIVITY  Your stomach muscles provide a lot of the support needed by your back.   If you have weak stomach muscles, your back may not get all the support it needs, especially when you are lifting or carrying heavy objects.   Good general physical condition is important for preventing strains, sprains, and other injuries. Exercise regularly and try to develop good tone in your abdominal (stomach) muscles.   The more you weigh, the more stress is placed on your back every time you bend over, at a ratio of 10:1. For every pound of weight, 10 times that amount of pressure is placed on the back.   Regular aerobic  exercise (walking, jogging, biking, swimming) has been shown to decrease back injuries.   Exercises that increase balance and strength can decrease your risk of falling and injuring your back or breaking bones. Exercises such as tai chi and yoga, or any weight-bearing exercise that challenges your balance, are good for increasing balance and strength.   Stretching and strengthening exercises can also reduce the risk of injuries.   Maintaining your ideal body weight is also important for having a healthy back.  DIET  In order to keep your spine strong, you will need to get enough calcium and vitamin D in your diet, to help prevent osteoporosis (bones becoming full of pores and weak, with age or diet deficiency).   Osteoporosis is responsible for many bone fractures that lead to back pain.   Calcium can be found in dairy products, green, leafy vegetables, and products with calcium added (fortified), like some orange juices.   Although your skin makes vitamin D when you are in the sun, you can also get it from your diet.   Vitamin D is found in milk and foods that have this vitamin added (fortified).   Many adults do not get enough calcium and vitamin D in their diet.   You should talk to your caregiver about how much calcium and vitamin D you need per day. Consider taking a nutritional supplement or a multivitamin.  POOR POSTURE  Sit and stand up straight.  It is best to try to maintain the back in its natural, slight "S" shaped curve.   Avoid leaning forward (unsupported) when you sit, or hunching over while you are standing.   A chair with good lumbar (low back) support helps protect the back when you sit.   If you work at a desk, sit close to your work so you do not need to lean over. Keep your chin tucked in. Keep your neck drawn back and elbows bent at 90 degrees to your spine.   Sit high and close to the steering wheel when you drive. Add a lumbar support to your car seat, if  needed.   Avoid sitting or standing too long in 1 position. Sitting can be hard on the lower back. Take breaks, get up, stretch and walk around frequently (at least every hour).   Avoid sleeping in an unnatural position. Sleep on your side (with knees slightly bent), or on your back (with a pillow under your knees). Do not sleep on your stomach.  OVEREXERTION AND SUDDEN TWISTS OR MOVEMENTS  Avoid working in odd, uncomfortable positions, such as when gardening, kneeling, or doing tasks that require you to bend over for long periods of time.   Strech before exerting: If you know you will be doing work that might stress your back, take the time to stretch and loosen your muscles before starting, just like an athlete before a workout. This will help you avoid painful strains and sprains.   Slow down: If you are doing a lot of heavy, repetitive lifting, work slowly. Allow yourself more recovery time between lifts. Over working your back will cost you more time later, when you need medical attention or when you find every movement painful.   It is important to recognize your physical limitations and abilities.   Do not hesitate to say, "This is too heavy for me to lift alone."   Many people have injured their backs because they were afraid to ask for help. Ask for help!   Certain actions, motions and movements are more likely than others to cause or contribute to back injuries.   Avoid heavy lifting, especially repetitive lifting over a long period of time.   Avoid twisting at the waist, while lifting or holding a heavy load.   Avoid reaching and lifting over your head, across a table, or for an object on an elevated surface. Do not lean forward and lift heavy objects that are far from your body.   Concentrate on keeping your shoulders pulled back and your low back straight.   Never bend over without bending your knees.   Bend at your knees, instead of your back, when you pick up objects.  Instead of using your back like a crane, let your legs do the work.   Try not to lift heavy things higher than your waist, or reach for objects on shelves above your head.   When lifting:   Take a balanced stance, with your feet about shoulder width apart. One foot can be behind the object and the other next to it.   Squat down to lift the object, but keep your heels off the floor.   Get as close to the object to be lifted as you can.   Lift gradually, without jerking, using (tightening) your leg, abdominal and buttock muscles. Keep the load as close to you as possible.   Keep your back straight.   Keep your chin tucked in, to maintain a  relatively straight back and neck line.   Once you are standing, change directions by pointing your feet in the direction you want to go and turning your whole body. Avoid twisting at your waist while carrying a load.   When you put a load down, use these same guidelines in reverse.   Reduce the amount of weight lifted. If you're moving books, it is better to load several small boxes rather than 1 heavy box.   Use handles and lifting straps.   Do not turn or twist while holding an object. Turn at your feet, not your back.   Avoid lifting or carrying objects with awkward or odd shapes. Get help if the shape is too awkward for you to lift and move by yourself.   The use of wide elastic belts, that can be worn and tightened to "pull in" lumbar and abdominal muscles, to prevent low back pain, is controversial.  STRESS  Tense muscles are more vulnerable to strains and spasms.   Mental stress that you experience is directed to your muscles, neck, and back.   Find constructive ways, including exercise and other relaxation techniques, to decrease the stress you experience and decrease its impact on your body.   Massage can help reduce the stress built up in your muscles and back.  ENVIRONMENTAL CAUSES AND SOLUTIONS  You could injure your back from  slipping on a wet floor or ice. Avoid wet and newly mopped surfaces, and make sure that ice around your home and office walkways is removed or treated.   Sleeping on a mattress that is too soft or too hard can hurt your back. If too soft, consider inserting a large plywood board between your mattress and box spring, or replacing your mattress. Mattresses and box springs should be replaced every 10 to 15 years, depending on the product.   Place, store, and position objects up off the floor. That way, you will not need to reach down to pick them up again.   Raise or lower shelves, so you do not need to reach, or twist your back, neck, and shoulders.   Put heavier objects on shelves at waist level, and lighter objects on lower or higher shelves.   Use carts and dollies to move objects, rather than carrying them yourself.   It is better to push a cart, dolly, lawnmower, or wheelbarrow, than it is to pull it. If you do need to pull it, force yourself to tighten your stomach muscles and try to maintain good body posture.   Use cranes, hoists, a lift table, or other lift-assist devices whenever you can.  Your caregiver can provide additional information about preventing back (and other injuries) when you seek care, to avoid a first or recurrent back injury. If you injure your back, visit your caregiver so you can be assessed and treated.   Document Released: 01/30/2004 Document Revised: 12/11/2010 Document Reviewed: 10/22/2008 [facility] Patient Information 2012 [place], [place].

## 2011-08-27 ENCOUNTER — Other Ambulatory Visit: Payer: Self-pay | Admitting: Internal Medicine

## 2011-11-04 ENCOUNTER — Ambulatory Visit (INDEPENDENT_AMBULATORY_CARE_PROVIDER_SITE_OTHER): Payer: 59 | Admitting: Internal Medicine

## 2011-11-04 DIAGNOSIS — Z23 Encounter for immunization: Secondary | ICD-10-CM

## 2011-11-19 ENCOUNTER — Telehealth: Payer: Self-pay | Admitting: Family Medicine

## 2011-11-19 DIAGNOSIS — G4710 Hypersomnia, unspecified: Secondary | ICD-10-CM

## 2011-11-19 NOTE — Telephone Encounter (Signed)
Patient feels he may have sleep apnea. Snoring, waking up at night, does not ever feel rested, etc. Wants to know if he can get a referral for a sleep study, or if needs to see [place] first. Please advise.

## 2011-11-20 NOTE — Telephone Encounter (Signed)
Referral order placed.

## 2011-11-20 NOTE — Telephone Encounter (Signed)
Schedule split night sleep study- dx= hypersomnolence

## 2011-12-27 ENCOUNTER — Ambulatory Visit (HOSPITAL_BASED_OUTPATIENT_CLINIC_OR_DEPARTMENT_OTHER): Payer: 59 | Attending: Internal Medicine | Admitting: Radiology

## 2011-12-27 VITALS — Ht 67.0 in | Wt 200.0 lb

## 2011-12-27 DIAGNOSIS — G4710 Hypersomnia, unspecified: Secondary | ICD-10-CM | POA: Insufficient documentation

## 2011-12-27 DIAGNOSIS — G4733 Obstructive sleep apnea (adult) (pediatric): Secondary | ICD-10-CM

## 2012-01-12 DIAGNOSIS — G4730 Sleep apnea, unspecified: Secondary | ICD-10-CM

## 2012-01-12 DIAGNOSIS — G4710 Hypersomnia, unspecified: Secondary | ICD-10-CM

## 2012-01-12 NOTE — Procedures (Signed)
NAME:  Scott Neal, Scott Neal NO.:  [id number]  MEDICAL RECORD NO.:  [id number]          PATIENT TYPE:  OUT  LOCATION:  SLEEP CENTER                 FACILITY:  [facility]  PHYSICIAN:  Barbaraann Share, MD,FCCPDATE OF BIRTH:  1955-01-04  DATE OF STUDY:  12/27/2011                           NOCTURNAL POLYSOMNOGRAM  REFERRING PHYSICIAN:  Bruce H. Swords, MD  INDICATION FOR STUDY:  Hypersomnia with sleep apnea.  EPWORTH SLEEPINESS SCORE:  13  MEDICATIONS:  SLEEP ARCHITECTURE:  The patient had total sleep time of 319 minutes with very little slow-wave sleep and decreased quantity of REM.  Sleep onset latency was normal at 12 minutes and REM onset was normal at 88 minutes.  Sleep efficiency was 82% during the diagnostic portion of the study, and 90% during the titration portion.  RESPIRATORY DATA:  The patient underwent a split night protocol where he was found to have 33 obstructive events in the first 126 minutes of sleep.  This gave him an apnea-hypopnea index of 16 events per hour during the diagnostic portion of the study.  The events occurred all in the supine position, and there was moderate snoring noted throughout. By protocol, the patient was then fitted with a medium ResMed Mirage Quattro FX full-face mask, and CPAP titration was initiated.  The patient was found to have fairly good control of his obstructive events and snoring with a pressure of 11 cm, but did have a few breakthroughs during REM.  Therefore, I would recommend a treatment pressure of 12 cm.  OXYGEN DATA:  There was O2 desaturation as low as 82% with the patient's obstructive events.  CARDIAC DATA:  No clinically significant arrhythmias were noted.  MOVEMENT-PARASOMNIA:  The patient had no significant leg jerks or other abnormal behavior seen.  IMPRESSIONS-RECOMMENDATIONS:  Split night study reveals mild obstructive sleep apnea/hypopnea syndrome, with an AHI of 16 events per hour during the  diagnostic portion of the study, and oxygen desaturation as low as 82%.  The patient was then fitted with a medium ResMed Mirage Quattro FX full-face mask, and found to have an optimal CPAP pressure of 12 cm of water.  Because of the mild nature of the patient's sleep apnea, other treatment options could also be considered, including a trial of weight loss alone, upper airway surgery, and finally dental appliance.     Barbaraann Share, MD,FCCP Diplomate, American Board of Sleep Medicine    [facility]/MEDQ  D:  01/12/2012 08:28:30  T:  01/12/2012 08:47:14  Job:  [id number]

## 2012-01-13 ENCOUNTER — Telehealth: Payer: Self-pay | Admitting: *Deleted

## 2012-01-13 NOTE — Telephone Encounter (Signed)
Pt aware of sleep study results, faxed order over to [facility]

## 2012-02-15 ENCOUNTER — Telehealth: Payer: Self-pay | Admitting: Internal Medicine

## 2012-02-15 NOTE — Telephone Encounter (Signed)
Pt received cpap machine on 02-12-12 and setting is 4 for 45 min and then it goes to 12. Pt said he is getting too much air. Please advise. Pt brought machine from [facility]

## 2012-02-15 NOTE — Telephone Encounter (Signed)
Left a message for [facility] to call back

## 2012-02-16 NOTE — Telephone Encounter (Signed)
Told [facility] that pt is stating that 12 cm is too much.  [facility] is going to contact pt and see if pt wants to do another auto titration. FYI for Dr Cato Mulligan

## 2012-02-18 NOTE — Telephone Encounter (Signed)
See if [facility] can do titration study at home

## 2012-03-02 ENCOUNTER — Telehealth: Payer: Self-pay

## 2012-03-02 NOTE — Telephone Encounter (Signed)
Personal message to dr. Merla Riches, subject is about :Piano. Best number is:  [phone] says he is long time friend for bob.

## 2012-03-09 ENCOUNTER — Other Ambulatory Visit: Payer: Self-pay | Admitting: Internal Medicine

## 2012-03-25 ENCOUNTER — Telehealth: Payer: Self-pay | Admitting: Internal Medicine

## 2012-03-25 MED ORDER — LOSARTAN POTASSIUM 100 MG PO TABS: 100.0000 mg | ORAL_TABLET | Freq: Every day | ORAL | Status: DC

## 2012-03-25 NOTE — Telephone Encounter (Signed)
A 3 mth supply was sent in to his pharmacy today.  This has already been taken care of

## 2012-03-25 NOTE — Telephone Encounter (Signed)
Pt needs a refill on losartan 100 mg call into [facility] [address]. Pt has appt on 05-25-12

## 2012-03-25 NOTE — Telephone Encounter (Signed)
Pt rescheduled his appt until 5/23 and expressed concern about this medication being filled to last him until then. Please assist.

## 2012-05-25 ENCOUNTER — Ambulatory Visit: Payer: 59 | Admitting: Internal Medicine

## 2012-05-26 ENCOUNTER — Ambulatory Visit (INDEPENDENT_AMBULATORY_CARE_PROVIDER_SITE_OTHER): Payer: 59 | Admitting: Internal Medicine

## 2012-05-26 ENCOUNTER — Encounter: Payer: Self-pay | Admitting: Internal Medicine

## 2012-05-26 VITALS — BP 144/100 | HR 80 | Temp 98.3°F | Wt 207.0 lb

## 2012-05-26 DIAGNOSIS — G4733 Obstructive sleep apnea (adult) (pediatric): Secondary | ICD-10-CM

## 2012-05-26 DIAGNOSIS — R739 Hyperglycemia, unspecified: Secondary | ICD-10-CM

## 2012-05-26 DIAGNOSIS — E785 Hyperlipidemia, unspecified: Secondary | ICD-10-CM

## 2012-05-26 DIAGNOSIS — R7309 Other abnormal glucose: Secondary | ICD-10-CM

## 2012-05-26 DIAGNOSIS — I10 Essential (primary) hypertension: Secondary | ICD-10-CM

## 2012-05-26 LAB — BASIC METABOLIC PANEL
BUN: 11 mg/dL (ref 6–23)
Chloride: 106 mEq/L (ref 96–112)
Glucose, Bld: 108 mg/dL — ABNORMAL HIGH (ref 70–99)
Potassium: 5.2 mEq/L — ABNORMAL HIGH (ref 3.5–5.1)
Sodium: 139 mEq/L (ref 135–145)

## 2012-05-26 LAB — LIPID PANEL: VLDL: 12 mg/dL (ref 0.0–40.0)

## 2012-05-26 LAB — HEPATIC FUNCTION PANEL
ALT: 25 U/L (ref 0–53)
AST: 20 U/L (ref 0–37)
Albumin: 4.1 g/dL (ref 3.5–5.2)
Alkaline Phosphatase: 40 U/L (ref 39–117)

## 2012-05-26 LAB — TSH: TSH: 0.91 u[IU]/mL (ref 0.35–5.50)

## 2012-05-26 LAB — HEMOGLOBIN A1C: Hgb A1c MFr Bld: 5.6 % (ref 4.6–6.5)

## 2012-05-26 MED ORDER — LOSARTAN POTASSIUM 100 MG PO TABS: 100.0000 mg | ORAL_TABLET | Freq: Every day | ORAL | Status: DC

## 2012-05-26 NOTE — Progress Notes (Signed)
Patient ID: Scott Neal, male   DOB: 1955-11-19, 57 y.o.   MRN: [id number]  htn-- no home bps Lipids Tolerating meds  He admits to fatigue during the day (sleepy). He has OSA-- doesn't use CPAP.   Repeat bp 132/95  Reviewed pmh, psh,  Reviewed meds   patient denies chest pain, shortness of breath, orthopnea. Denies lower extremity edema, abdominal pain, change in appetite, change in bowel movements. Patient denies rashes, musculoskeletal complaints. No other specific complaints in a complete review of systems.    well-developed well-nourished male in no acute distress. HEENT exam atraumatic, normocephalic, neck supple without jugular venous distention. Chest clear to auscultation cardiac exam S1-S2 are regular. Abdominal exam overweight with bowel sounds, soft and nontender. Extremities no edema. Neurologic exam is alert with a normal gait.

## 2012-05-27 ENCOUNTER — Ambulatory Visit: Payer: 59 | Admitting: Internal Medicine

## 2012-05-27 ENCOUNTER — Telehealth: Payer: Self-pay | Admitting: Internal Medicine

## 2012-05-27 MED ORDER — SIMVASTATIN 80 MG PO TABS: 80.0000 mg | ORAL_TABLET | Freq: Every day | ORAL | Status: DC

## 2012-05-27 NOTE — Telephone Encounter (Signed)
rx sent in electronically 

## 2012-05-27 NOTE — Telephone Encounter (Signed)
Pt needs refill of simvastatin (ZOCOR) 80 MG tablet. 90 day refill  Pt forgot to mention to Dr Cato Mulligan at his visit 5/22. Pharm:  [facility]/ [address]

## 2012-05-28 DIAGNOSIS — G4733 Obstructive sleep apnea (adult) (pediatric): Secondary | ICD-10-CM | POA: Insufficient documentation

## 2012-05-28 NOTE — Assessment & Plan Note (Signed)
Encouraged to lose weight and use cpap nightly

## 2012-05-28 NOTE — Assessment & Plan Note (Signed)
Repeat bp is ok Weight loss encouraged Increase losartan to 100mg  po qd

## 2012-05-28 NOTE — Assessment & Plan Note (Signed)
Continue same meds Check labs

## 2012-09-29 ENCOUNTER — Encounter: Payer: Self-pay | Admitting: Internal Medicine

## 2012-09-29 ENCOUNTER — Ambulatory Visit (INDEPENDENT_AMBULATORY_CARE_PROVIDER_SITE_OTHER): Payer: 59 | Admitting: Internal Medicine

## 2012-09-29 VITALS — BP 130/90 | Temp 98.3°F | Wt 208.0 lb

## 2012-09-29 DIAGNOSIS — J069 Acute upper respiratory infection, unspecified: Secondary | ICD-10-CM

## 2012-09-29 MED ORDER — DOXYCYCLINE HYCLATE 100 MG PO TABS: 100.0000 mg | ORAL_TABLET | Freq: Two times a day (BID) | ORAL | Status: AC

## 2012-09-29 NOTE — Assessment & Plan Note (Signed)
58 year old white male with probable viral upper respiratory infection. Patient to try symptomatic treatment for additional 3-5 days. Considering his smoking history a patient of high-risk for developing secondary bronchitis. Patient understands should he develop fever greater than 101.5 or purulent cough, he will start doxycycline 100 mg twice daily for 10 days. Patient instructed to use acetaminophen 650 mg every 8 hours as needed.  Patient advised to call office if symptoms persist or worsen.

## 2012-09-29 NOTE — Progress Notes (Signed)
  Subjective:    Patient ID: Scott Neal, male    DOB: 11-05-1955, 57 y.o.   MRN: [id number]  HPI  57 year old white male with history of hypertension and hyperlipidemia complains of sore throat and cough for 4 days. He has remote history of smoking he quit in 1999. He describes a "deep chest cough". He also feels "out of it" and has generalized achiness. He has been out of work since Tuesday morning.  Cough is non-productive. He describes mild chest tightness no significant shortness of breath. His 27 year old son had similar upper respiratory symptoms one to 2 weeks ago.  Review of Systems See history of present illness    Past Medical History  Diagnosis Date  . Hyperlipidemia   . Hypertension   . Allergy     History   Social History  . Marital Status: Married    Spouse Name: N/A    Number of Children: N/A  . Years of Education: N/A   Occupational History  . Not on file.   Social History Main Topics  . Smoking status: Former Smoker    Quit date: 03/11/1997  . Smokeless tobacco: Never Used  . Alcohol Use: 1.5 oz/week    3 drink(s) per week  . Drug Use: No  . Sexual Activity: Not on file   Other Topics Concern  . Not on file   Social History Narrative  . No narrative on file    Past Surgical History  Procedure Laterality Date  . Appendectomy    . Sinus irrigation  2003    Family History  Problem Relation Age of Onset  . Heart disease Father   . Heart attack Father   . Diabetes Brother   . Nephrolithiasis Daughter   . GER disease Mother   . Hypertension Brother   . Hypertension Brother     No Known Allergies  Current Outpatient Prescriptions on File Prior to Visit  Medication Sig Dispense Refill  . glucosamine-chondroitin 500-400 MG tablet Take 1 tablet by mouth daily.        [name] Kitchen losartan (COZAAR) 100 MG tablet Take 1 tablet (100 mg total) by mouth daily.  90 tablet  3  . Multiple Vitamin (MULTIVITAMIN) tablet Take 1 tablet by mouth daily.          No current facility-administered medications on file prior to visit.    BP 130/90  Temp(Src) 98.3 F (36.8 C) (Oral)  Wt 208 lb (94.348 kg)  BMI 32.57 kg/m2  SpO2 97%    Objective:   Physical Exam  Constitutional: He is oriented to person, place, and time. He appears well-developed and well-nourished.  HENT:  Head: Normocephalic and atraumatic.  Right Ear: External ear normal.  Left Ear: External ear normal.  Mouth/Throat: No oropharyngeal exudate.  Mild oropharyngeal erythema  Cardiovascular: Normal rate, regular rhythm and normal heart sounds.   No murmur heard. Pulmonary/Chest: Effort normal and breath sounds normal. He has no wheezes.  Neurological: He is alert and oriented to person, place, and time. No cranial nerve deficit.  Psychiatric: He has a normal mood and affect. His behavior is normal.          Assessment & Plan:

## 2012-09-29 NOTE — Patient Instructions (Signed)
Drink plenty of fluids Use tylenol 650 mg every eight hours as needed Continue use Mucinex over the counter. Please contact our office if your symptoms do not improve or gets worse. Monitor your blood pressure at home.

## 2012-11-30 ENCOUNTER — Ambulatory Visit (INDEPENDENT_AMBULATORY_CARE_PROVIDER_SITE_OTHER): Payer: 59

## 2012-11-30 DIAGNOSIS — Z23 Encounter for immunization: Secondary | ICD-10-CM

## 2013-06-01 ENCOUNTER — Telehealth: Payer: Self-pay | Admitting: Internal Medicine

## 2013-06-01 MED ORDER — SIMVASTATIN 80 MG PO TABS: 40.0000 mg | ORAL_TABLET | Freq: Every day | ORAL | Status: DC

## 2013-06-01 NOTE — Telephone Encounter (Signed)
[facility] [place] is requesting re-fill on simvastatin (ZOCOR) 80 MG tablet

## 2013-06-01 NOTE — Telephone Encounter (Signed)
rx sent in electronically 

## 2013-06-19 ENCOUNTER — Other Ambulatory Visit: Payer: Self-pay | Admitting: Internal Medicine

## 2013-07-31 ENCOUNTER — Encounter: Payer: Self-pay | Admitting: Family

## 2013-07-31 ENCOUNTER — Ambulatory Visit (INDEPENDENT_AMBULATORY_CARE_PROVIDER_SITE_OTHER): Payer: 59 | Admitting: Family

## 2013-07-31 VITALS — BP 128/90 | HR 75 | Wt 223.0 lb

## 2013-07-31 DIAGNOSIS — G4733 Obstructive sleep apnea (adult) (pediatric): Secondary | ICD-10-CM

## 2013-07-31 DIAGNOSIS — E7800 Pure hypercholesterolemia, unspecified: Secondary | ICD-10-CM

## 2013-07-31 DIAGNOSIS — I10 Essential (primary) hypertension: Secondary | ICD-10-CM

## 2013-07-31 LAB — COMPREHENSIVE METABOLIC PANEL
ALBUMIN: 4 g/dL (ref 3.5–5.2)
ALK PHOS: 43 U/L (ref 39–117)
ALT: 28 U/L (ref 0–53)
AST: 25 U/L (ref 0–37)
BUN: 17 mg/dL (ref 6–23)
CALCIUM: 9 mg/dL (ref 8.4–10.5)
CHLORIDE: 111 meq/L (ref 96–112)
CO2: 27 mEq/L (ref 19–32)
CREATININE: 1.4 mg/dL (ref 0.4–1.5)
GFR: 55.48 mL/min — ABNORMAL LOW (ref >60.00)
GLUCOSE: 102 mg/dL — AB (ref 70–99)
POTASSIUM: 4.5 meq/L (ref 3.5–5.1)
Sodium: 145 mEq/L (ref 135–145)
Total Bilirubin: 0.7 mg/dL (ref 0.2–1.2)
Total Protein: 6.9 g/dL (ref 6.0–8.3)

## 2013-07-31 MED ORDER — LOSARTAN POTASSIUM 100 MG PO TABS: ORAL_TABLET | ORAL | Status: DC

## 2013-07-31 NOTE — Progress Notes (Signed)
Subjective:    Patient ID: Scott Neal, male    DOB: 1955-09-19, 58 y.o.   MRN: [id number]  HPI 58 year old white male, nonsmoker with a history of hypertension, hyperlipidemia, shortness the back he is in today for recheck. He's currently taking losartan 100 mg once daily and tolerating it well. Blood pressures are typically 120-130/90. Has not had fasting labs in over a year. Plans to establish with Dr. Yong Channel for complete physical exam.   Review of Systems  Constitutional: Negative.   HENT: Negative.   Respiratory: Negative.   Cardiovascular: Negative.   Gastrointestinal: Negative.   Endocrine: Negative.   Genitourinary: Negative.   Musculoskeletal: Negative.   Skin: Negative.   Neurological: Negative.   Psychiatric/Behavioral: Negative.    Past Medical History  Diagnosis Date  . Hyperlipidemia   . Hypertension   . Allergy     History   Social History  . Marital Status: Married    Spouse Name: N/A    Number of Children: N/A  . Years of Education: N/A   Occupational History  . Not on file.   Social History Main Topics  . Smoking status: Former Smoker    Quit date: 03/11/1997  . Smokeless tobacco: Never Used  . Alcohol Use: 1.5 oz/week    3 drink(s) per week  . Drug Use: No  . Sexual Activity: Not on file   Other Topics Concern  . Not on file   Social History Narrative  . No narrative on file    Past Surgical History  Procedure Laterality Date  . Appendectomy    . Sinus irrigation  2003    Family History  Problem Relation Age of Onset  . Heart disease Father   . Heart attack Father   . Diabetes Brother   . Nephrolithiasis Daughter   . GER disease Mother   . Hypertension Brother   . Hypertension Brother     No Known Allergies  Current Outpatient Prescriptions on File Prior to Visit  Medication Sig Dispense Refill  . glucosamine-chondroitin 500-400 MG tablet Take 1 tablet by mouth daily.        . Multiple Vitamin (MULTIVITAMIN) tablet  Take 1 tablet by mouth daily.        . simvastatin (ZOCOR) 80 MG tablet Take 0.5 tablets (40 mg total) by mouth at bedtime.  45 tablet  0   No current facility-administered medications on file prior to visit.    BP 128/90  Pulse 75  Wt 223 lb (101.152 kg)chart    Objective:   Physical Exam  Constitutional: He is oriented to person, place, and time. He appears well-developed and well-nourished.  HENT:  Right Ear: External ear normal.  Left Ear: External ear normal.  Nose: Nose normal.  Mouth/Throat: Oropharynx is clear and moist.  Neck: Normal range of motion. Neck supple.  Cardiovascular: Normal rate, regular rhythm and normal heart sounds.   Recheck blood pressure 130/90  Pulmonary/Chest: Effort normal and breath sounds normal.  Abdominal: Soft. Bowel sounds are normal.  Musculoskeletal: Normal range of motion.  Neurological: He is alert and oriented to person, place, and time.  Skin: Skin is warm and dry.  Psychiatric: He has a normal mood and affect.          Assessment & Plan:  Goldman was seen today for medication refill.  Diagnoses and associated orders for this visit:  Unspecified essential hypertension - CMP  Pure hypercholesterolemia - CMP  OSA (obstructive sleep apnea)  Other  Orders - Discontinue: losartan (COZAAR) 100 MG tablet; TAKE 1 TABLET (100 MG TOTAL) BY MOUTH DAILY. - losartan (COZAAR) 100 MG tablet; TAKE 1 TABLET (100 MG TOTAL) BY MOUTH DAILY.    recheck for complete physical exam within the next 4 weeks. Advised to consider Hyzaar and get further blood pressure reduction on the diastolic number. Patient declined today. Low-sodium diet. Exercise.

## 2013-07-31 NOTE — Patient Instructions (Signed)

## 2013-07-31 NOTE — Progress Notes (Signed)
Pre visit review using our clinic review tool, if applicable. No additional management support is needed unless otherwise documented below in the visit note. 

## 2013-09-25 ENCOUNTER — Telehealth: Payer: Self-pay | Admitting: Internal Medicine

## 2013-09-25 NOTE — Telephone Encounter (Signed)
Error

## 2013-09-29 ENCOUNTER — Telehealth: Payer: Self-pay | Admitting: Internal Medicine

## 2013-09-29 MED ORDER — SIMVASTATIN 80 MG PO TABS: 40.0000 mg | ORAL_TABLET | Freq: Every day | ORAL | Status: DC

## 2013-09-29 MED ORDER — LOSARTAN POTASSIUM 100 MG PO TABS: ORAL_TABLET | ORAL | Status: DC

## 2013-09-29 NOTE — Telephone Encounter (Signed)
Medications refilled

## 2013-09-29 NOTE — Telephone Encounter (Addendum)
Pt needs refill on losartan 100 mg and simvastatin 80 mg #90 W/REFILLS each [facility]. Pt has appt with dr Retail banker

## 2014-01-06 ENCOUNTER — Other Ambulatory Visit: Payer: Self-pay | Admitting: Internal Medicine

## 2014-03-06 ENCOUNTER — Ambulatory Visit (INDEPENDENT_AMBULATORY_CARE_PROVIDER_SITE_OTHER): Payer: 59 | Admitting: Family Medicine

## 2014-03-06 ENCOUNTER — Encounter: Payer: Self-pay | Admitting: Family Medicine

## 2014-03-06 VITALS — BP 120/80 | Temp 98.6°F | Wt 222.0 lb

## 2014-03-06 DIAGNOSIS — Z87891 Personal history of nicotine dependence: Secondary | ICD-10-CM | POA: Insufficient documentation

## 2014-03-06 DIAGNOSIS — R739 Hyperglycemia, unspecified: Secondary | ICD-10-CM

## 2014-03-06 DIAGNOSIS — I10 Essential (primary) hypertension: Secondary | ICD-10-CM

## 2014-03-06 DIAGNOSIS — G4700 Insomnia, unspecified: Secondary | ICD-10-CM

## 2014-03-06 DIAGNOSIS — E785 Hyperlipidemia, unspecified: Secondary | ICD-10-CM

## 2014-03-06 NOTE — Assessment & Plan Note (Signed)
Try melatonin, stay off caffeine, consider retrial CPAP, check in a few months from now By phone or message. If no success, trial ambien extended release as sleep maintenance issue

## 2014-03-06 NOTE — Assessment & Plan Note (Signed)
Good control on losartan 100mg . No change.

## 2014-03-06 NOTE — Progress Notes (Signed)
Scott Reddish, MD Phone: [phone]  Subjective:  Patient presents today to establish care with me as their new primary care provider. Patient was formerly a patient of Dr. Leanne Chang. Chief complaint-noted.   Insomnia- poor control Goes to bed around 11 pm, Wake up 3-4 am. Gave up coffee and caffeine for lent and only slight difference. PHQ 2 of 0. No trouble falling asleep. Melatonin was somewhat helpful.  ROS- No Si/HI. No vivid dreams.   Hyperlipidemia-controlled previously on 40mg  simvastatin  Lab Results  Component Value Date   LDLCALC 88 05/26/2012  Regular exercise: advised ROS- no chest pain or shortness of breath. No myalgias  Hypertension-controlled on losartan  BP Readings from Last 3 Encounters:  03/06/14 120/80  07/31/13 128/90  09/29/12 130/90   Home BP monitoring-no Compliant with medications-yes without side effects ROS-Denies any CP, HA, SOB, blurry vision, LE edema.   The following were reviewed and entered/updated in epic: Past Medical History  Diagnosis Date  . Hyperlipidemia   . Hypertension   . Allergy    Patient Active Problem List   Diagnosis Date Noted  . OSA on CPAP 05/28/2012    Priority: Medium  . Essential hypertension 03/25/2007    Priority: Medium  . Hyperlipidemia 07/29/2006    Priority: Medium  . Former smoker 03/06/2014    Priority: Low  . Insomnia 03/06/2014    Priority: Low  . Allergic rhinitis 06/21/2009    Priority: Low   Past Surgical History  Procedure Laterality Date  . Appendectomy    . Sinus irrigation  2003  . Cataract lens implants      bilateral    Family History  Problem Relation Age of Onset  . Heart attack Father     23s, heavy smoker  . Diabetes Brother     x2  . Nephrolithiasis Daughter   . GER disease Mother   . Hypertension Brother     x2    Medications- reviewed and updated Current Outpatient Prescriptions  Medication Sig Dispense Refill  . glucosamine-chondroitin 500-400 MG tablet Take 1  tablet by mouth daily.      [name] Kitchen losartan (COZAAR) 100 MG tablet TAKE 1 TABLET (100 MG TOTAL) BY MOUTH DAILY. 90 tablet 1  . Multiple Vitamin (MULTIVITAMIN) tablet Take 1 tablet by mouth daily.      . simvastatin (ZOCOR) 80 MG tablet TAKE 0.5 TABLETS (40 MG TOTAL) BY MOUTH AT BEDTIME. 45 tablet 0   No current facility-administered medications for this visit.    Allergies-reviewed and updated No Known Allergies  History   Social History  . Marital Status: Married    Spouse Name: N/A  . Number of Children: N/A  . Years of Education: N/A   Social History Main Topics  . Smoking status: Former Smoker -- 0.75 packs/day for 4 years    Types: Cigarettes    Quit date: 03/11/1997  . Smokeless tobacco: Never Used  . Alcohol Use: 6.0 oz/week    10 Standard drinks or equivalent per week  . Drug Use: No  . Sexual Activity: Not on file   Other Topics Concern  . Not on file   Social History Narrative   Married (wife patient outside practice) 3 children, 1 step child. 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       Hobbies: formerly played hockey-wants to get back in, golf, plays music    ROS--See HPI   Objective: BP 120/80 mmHg  Temp(Src) 98.6 F (  37 C)  Wt 222 lb (100.699 kg) Gen: NAD, resting comfortably HEENT: Mucous membranes are moist. Oropharynx normal. Good dentition.  CV: RRR no murmurs rubs or gallops Lungs: CTAB no crackles, wheeze, rhonchi Abdomen: soft/nontender/nondistended/normal bowel sounds. Mildly obese.  Ext: no edema Skin: warm, dry, no rash   Assessment/Plan:  Essential hypertension Good control on losartan 100mg . No change.    Hyperlipidemia Good control on simvastain 40mg  previously. Return future fasting labs to update   Insomnia Try melatonin, stay off caffeine, consider retrial CPAP, check in a few months from now By phone or message. If no success, trial ambien extended release as sleep maintenance issue   6-12 month follow up if  labs ok.  May call to add PSA to labs- would do under healthy adult  Orders Placed This Encounter  Procedures  . CBC    [place]    Standing Status: Future     Number of Occurrences:      Standing Expiration Date: 03/06/2015  . Comprehensive metabolic panel    [place]    Standing Status: Future     Number of Occurrences:      Standing Expiration Date: 03/06/2015    Order Specific Question:  Has the patient fasted?    Answer:  No  . Lipid panel    [place]    Standing Status: Future     Number of Occurrences:      Standing Expiration Date: 03/06/2015    Order Specific Question:  Has the patient fasted?    Answer:  No  . Hemoglobin A1c    [place]    Standing Status: Future     Number of Occurrences:      Standing Expiration Date: 03/06/2015  . TSH    [place]    Standing Status: Future     Number of Occurrences:      Standing Expiration Date: 03/06/2015  . POCT urinalysis dipstick    Standing Status: Future     Number of Occurrences:      Standing Expiration Date: 03/06/2015

## 2014-03-06 NOTE — Assessment & Plan Note (Signed)
Good control on simvastain 40mg  previously. Return future fasting labs to update

## 2014-03-06 NOTE — Patient Instructions (Addendum)
Try melatonin, stay off caffeine, consider CPAP, check in a few months from now. By phone or message ok.   Come back for fasting bloodwork within next few weeks. We will message or call with results.   Things overall look pretty good but would love for you to target your weight back into the 190-200 range.

## 2014-04-11 ENCOUNTER — Other Ambulatory Visit (INDEPENDENT_AMBULATORY_CARE_PROVIDER_SITE_OTHER): Payer: 59

## 2014-04-11 DIAGNOSIS — R739 Hyperglycemia, unspecified: Secondary | ICD-10-CM | POA: Diagnosis not present

## 2014-04-11 DIAGNOSIS — E785 Hyperlipidemia, unspecified: Secondary | ICD-10-CM | POA: Diagnosis not present

## 2014-04-11 DIAGNOSIS — I10 Essential (primary) hypertension: Secondary | ICD-10-CM

## 2014-04-11 DIAGNOSIS — Z87891 Personal history of nicotine dependence: Secondary | ICD-10-CM

## 2014-04-11 LAB — LIPID PANEL
CHOLESTEROL: 150 mg/dL (ref 0–200)
HDL: 52.3 mg/dL (ref >39.00)
LDL CALC: 83 mg/dL (ref 0–99)
NonHDL: 97.7
TRIGLYCERIDES: 76 mg/dL (ref 0.0–149.0)
Total CHOL/HDL Ratio: 3
VLDL: 15.2 mg/dL (ref 0.0–40.0)

## 2014-04-11 LAB — HEMOGLOBIN A1C: Hgb A1c MFr Bld: 5.7 % (ref 4.6–6.5)

## 2014-04-11 LAB — COMPREHENSIVE METABOLIC PANEL
ALT: 35 U/L (ref 0–53)
AST: 21 U/L (ref 0–37)
Albumin: 4.2 g/dL (ref 3.5–5.2)
Alkaline Phosphatase: 52 U/L (ref 39–117)
BILIRUBIN TOTAL: 0.8 mg/dL (ref 0.2–1.2)
BUN: 15 mg/dL (ref 6–23)
CO2: 28 meq/L (ref 19–32)
CREATININE: 0.88 mg/dL (ref 0.40–1.50)
Calcium: 9.5 mg/dL (ref 8.4–10.5)
Chloride: 105 mEq/L (ref 96–112)
GFR: 93.81 mL/min (ref >60.00)
Glucose, Bld: 111 mg/dL — ABNORMAL HIGH (ref 70–99)
Potassium: 5.3 mEq/L — ABNORMAL HIGH (ref 3.5–5.1)
Sodium: 139 mEq/L (ref 135–145)
Total Protein: 6.8 g/dL (ref 6.0–8.3)

## 2014-04-11 LAB — POCT URINALYSIS DIPSTICK
Bilirubin, UA: NEGATIVE
Blood, UA: NEGATIVE
Glucose, UA: NEGATIVE
KETONES UA: NEGATIVE
LEUKOCYTES UA: NEGATIVE
NITRITE UA: NEGATIVE
Protein, UA: NEGATIVE
SPEC GRAV UA: 1.02
UROBILINOGEN UA: 0.2
pH, UA: 6

## 2014-04-11 LAB — CBC
HCT: 41.4 % (ref 39.0–52.0)
Hemoglobin: 14.1 g/dL (ref 13.0–17.0)
MCHC: 34 g/dL (ref 30.0–36.0)
MCV: 91.1 fl (ref 78.0–100.0)
PLATELETS: 251 10*3/uL (ref 150.0–400.0)
RBC: 4.54 Mil/uL (ref 4.22–5.81)
RDW: 14 % (ref 11.5–15.5)
WBC: 8.9 10*3/uL (ref 4.0–10.5)

## 2014-04-11 LAB — TSH: TSH: 1.34 u[IU]/mL (ref 0.35–4.50)

## 2014-04-26 ENCOUNTER — Encounter: Payer: Self-pay | Admitting: Gastroenterology

## 2014-08-14 ENCOUNTER — Other Ambulatory Visit: Payer: Self-pay | Admitting: Family

## 2014-12-12 ENCOUNTER — Other Ambulatory Visit: Payer: Self-pay | Admitting: Internal Medicine

## 2015-01-28 ENCOUNTER — Other Ambulatory Visit: Payer: Self-pay | Admitting: Family Medicine

## 2015-02-04 ENCOUNTER — Ambulatory Visit (INDEPENDENT_AMBULATORY_CARE_PROVIDER_SITE_OTHER): Payer: 59 | Admitting: Family Medicine

## 2015-02-04 ENCOUNTER — Encounter: Payer: Self-pay | Admitting: Family Medicine

## 2015-02-04 VITALS — BP 132/92 | HR 84 | Temp 98.7°F | Wt 224.0 lb

## 2015-02-04 DIAGNOSIS — I10 Essential (primary) hypertension: Secondary | ICD-10-CM

## 2015-02-04 DIAGNOSIS — E785 Hyperlipidemia, unspecified: Secondary | ICD-10-CM

## 2015-02-04 DIAGNOSIS — R739 Hyperglycemia, unspecified: Secondary | ICD-10-CM | POA: Insufficient documentation

## 2015-02-04 DIAGNOSIS — Z20828 Contact with and (suspected) exposure to other viral communicable diseases: Secondary | ICD-10-CM

## 2015-02-04 MED ORDER — ZOSTER VACCINE LIVE 19400 UNT/0.65ML ~~LOC~~ SOLR: 0.6500 mL | Freq: Once | SUBCUTANEOUS | Status: DC

## 2015-02-04 NOTE — Assessment & Plan Note (Signed)
S: well controlled on simvastatin 40mg  previously. No myalgias.  Lab Results  Component Value Date   CHOL 150 04/11/2014   HDL 52.30 04/11/2014   LDLCALC 83 04/11/2014   TRIG 76.0 04/11/2014   CHOLHDL 3 04/11/2014   A/P: patient wants to update labs. Seems to be anxious about worsening, check direct LDL only then full panel at CPE

## 2015-02-04 NOTE — Patient Instructions (Addendum)
Schedule a lab visit at the check out desk. Return for future fasting labs. Nothing but water after midnight please.   Schedule a physical in mid April or later with labs a few days before. Make sure lab adds PSA since you are interested.   Printed for shingles vaccine. If you want this- check with insurance and see if they want you to get at pharmacy or here.

## 2015-02-04 NOTE — Progress Notes (Signed)
Garret Reddish, MD  Subjective:  Scott Neal is a 60 y.o. year old very pleasant male patient who presents for/with See problem oriented charting ROS- No chest pain or shortness of breath. No headache or blurry vision.   Past Medical History-  Patient Active Problem List   Diagnosis Date Noted  . Hyperglycemia 02/04/2015    Priority: Medium  . OSA on CPAP 05/28/2012    Priority: Medium  . Essential hypertension 03/25/2007    Priority: Medium  . Hyperlipidemia 07/29/2006    Priority: Medium  . Former smoker 03/06/2014    Priority: Low  . Insomnia 03/06/2014    Priority: Low  . Allergic rhinitis 06/21/2009    Priority: Low    Medications- reviewed and updated Current Outpatient Prescriptions  Medication Sig Dispense Refill  . losartan (COZAAR) 100 MG tablet TAKE 1 TABLET (100 MG TOTAL) BY MOUTH DAILY. 90 tablet 1  . Multiple Vitamin (MULTIVITAMIN) tablet Take 1 tablet by mouth daily.      . simvastatin (ZOCOR) 80 MG tablet TAKE 0.5 TABLETS (40 MG TOTAL) BY MOUTH AT BEDTIME. 45 tablet 6  . zoster vaccine live, PF, (ZOSTAVAX) 16109 UNT/0.65ML injection Inject 19,400 Units into the skin once. 1 each 0   No current facility-administered medications for this visit.    Objective: BP 132/92 mmHg  Pulse 84  Temp(Src) 98.7 F (37.1 C)  Wt 224 lb (101.606 kg) Gen: NAD, resting comfortably CV: RRR no murmurs rubs or gallops Lungs: CTAB no crackles, wheeze, rhonchi Abdomen: soft/nontender/nondistended/normal bowel sounds.  Ext: no edema Skin: warm, dry Neuro: grossly normal, moves all extremities  Assessment/Plan:  Hyperlipidemia S: well controlled on simvastatin 40mg  previously. No myalgias.  Lab Results  Component Value Date   CHOL 150 04/11/2014   HDL 52.30 04/11/2014   LDLCALC 83 04/11/2014   TRIG 76.0 04/11/2014   CHOLHDL 3 04/11/2014   A/P: patient wants to update labs. Seems to be anxious about worsening, check direct LDL only then full panel at  CPE    Essential hypertension S: poorly controlled on diastolic measure on 2/3 readings.  BP Readings from Last 3 Encounters:  02/04/15 132/92  03/06/14 120/80  07/31/13 128/90  A/P:Continue current medications. We focused on healthy eating and exercise. If not controlled at follow up or if potassium high on labs- consider adding hctz    Return in about 3 months (around 05/05/2015). for CPE Return precautions advised.   Orders Placed This Encounter  Procedures  . Hemoglobin A1c    [place]    Standing Status: Future     Number of Occurrences:      Standing Expiration Date: 02/04/2016  . Lipid panel    Standing Status: Future     Number of Occurrences:      Standing Expiration Date: 02/04/2016  . CBC    Standing Status: Future     Number of Occurrences:      Standing Expiration Date: 02/04/2016  . Comprehensive metabolic panel    [place]    Standing Status: Future     Number of Occurrences:      Standing Expiration Date: 02/04/2016  . Hepatitis C antibody, reflex    [facility]    Standing Status: Future     Number of Occurrences:      Standing Expiration Date: 02/04/2016  . HIV antibody    Standing Status: Future     Number of Occurrences:      Standing Expiration Date: 02/04/2016  . POCT urinalysis dipstick  In house    Standing Status: Future     Number of Occurrences:      Standing Expiration Date: 02/04/2016   Reports mother told him all his siblings and him had chicken pox- will check with insurance Meds ordered this encounter  Medications  . zoster vaccine live, PF, (ZOSTAVAX) 91478 UNT/0.65ML injection    Sig: Inject 19,400 Units into the skin once.    Dispense:  1 each    Refill:  0

## 2015-02-04 NOTE — Assessment & Plan Note (Signed)
S: poorly controlled on diastolic measure on 2/3 readings.  BP Readings from Last 3 Encounters:  02/04/15 132/92  03/06/14 120/80  07/31/13 128/90  A/P:Continue current medications. We focused on healthy eating and exercise. If not controlled at follow up or if potassium high on labs- consider adding hctz

## 2015-02-21 ENCOUNTER — Other Ambulatory Visit (INDEPENDENT_AMBULATORY_CARE_PROVIDER_SITE_OTHER): Payer: 59

## 2015-02-21 ENCOUNTER — Other Ambulatory Visit: Payer: Self-pay | Admitting: Family Medicine

## 2015-02-21 DIAGNOSIS — R739 Hyperglycemia, unspecified: Secondary | ICD-10-CM | POA: Diagnosis not present

## 2015-02-21 DIAGNOSIS — E785 Hyperlipidemia, unspecified: Secondary | ICD-10-CM | POA: Diagnosis not present

## 2015-02-21 DIAGNOSIS — I10 Essential (primary) hypertension: Secondary | ICD-10-CM

## 2015-02-21 DIAGNOSIS — Z20828 Contact with and (suspected) exposure to other viral communicable diseases: Secondary | ICD-10-CM

## 2015-02-21 LAB — POCT URINALYSIS DIPSTICK
BILIRUBIN UA: NEGATIVE
GLUCOSE UA: NEGATIVE
Ketones, UA: NEGATIVE
Leukocytes, UA: NEGATIVE
NITRITE UA: NEGATIVE
Protein, UA: NEGATIVE
RBC UA: NEGATIVE
Spec Grav, UA: 1.025
UROBILINOGEN UA: 0.2
pH, UA: 6

## 2015-02-21 LAB — CBC
HCT: 40.7 % (ref 39.0–52.0)
Hemoglobin: 14.1 g/dL (ref 13.0–17.0)
MCHC: 34.5 g/dL (ref 30.0–36.0)
MCV: 92 fl (ref 78.0–100.0)
Platelets: 233 10*3/uL (ref 150.0–400.0)
RBC: 4.43 Mil/uL (ref 4.22–5.81)
RDW: 13.3 % (ref 11.5–15.5)
WBC: 6.1 10*3/uL (ref 4.0–10.5)

## 2015-02-21 LAB — COMPREHENSIVE METABOLIC PANEL
ALT: 45 U/L (ref 0–53)
AST: 22 U/L (ref 0–37)
Albumin: 4.5 g/dL (ref 3.5–5.2)
Alkaline Phosphatase: 40 U/L (ref 39–117)
BILIRUBIN TOTAL: 0.7 mg/dL (ref 0.2–1.2)
BUN: 15 mg/dL (ref 6–23)
CHLORIDE: 106 meq/L (ref 96–112)
CO2: 27 meq/L (ref 19–32)
CREATININE: 0.88 mg/dL (ref 0.40–1.50)
Calcium: 9.5 mg/dL (ref 8.4–10.5)
GFR: 93.54 mL/min (ref >60.00)
GLUCOSE: 103 mg/dL — AB (ref 70–99)
Potassium: 5.4 mEq/L — ABNORMAL HIGH (ref 3.5–5.1)
Sodium: 142 mEq/L (ref 135–145)
Total Protein: 6.7 g/dL (ref 6.0–8.3)

## 2015-02-21 LAB — LIPID PANEL
Cholesterol: 163 mg/dL (ref 0–200)
HDL: 56.5 mg/dL (ref >39.00)
LDL Cholesterol: 88 mg/dL (ref 0–99)
NONHDL: 106.42
Total CHOL/HDL Ratio: 3
Triglycerides: 90 mg/dL (ref 0.0–149.0)
VLDL: 18 mg/dL (ref 0.0–40.0)

## 2015-02-21 LAB — HEMOGLOBIN A1C: HEMOGLOBIN A1C: 5.9 % (ref 4.6–6.5)

## 2015-02-22 ENCOUNTER — Other Ambulatory Visit: Payer: Self-pay | Admitting: Family Medicine

## 2015-02-22 DIAGNOSIS — I10 Essential (primary) hypertension: Secondary | ICD-10-CM

## 2015-02-22 LAB — HEPATITIS C ANTIBODY: HCV AB: NEGATIVE

## 2015-02-22 LAB — HIV ANTIBODY (ROUTINE TESTING W REFLEX): HIV: NONREACTIVE

## 2015-02-26 ENCOUNTER — Other Ambulatory Visit: Payer: 59

## 2015-02-27 ENCOUNTER — Other Ambulatory Visit (INDEPENDENT_AMBULATORY_CARE_PROVIDER_SITE_OTHER): Payer: 59

## 2015-02-27 DIAGNOSIS — I10 Essential (primary) hypertension: Secondary | ICD-10-CM | POA: Diagnosis not present

## 2015-02-27 DIAGNOSIS — N400 Enlarged prostate without lower urinary tract symptoms: Secondary | ICD-10-CM

## 2015-02-27 DIAGNOSIS — Z1159 Encounter for screening for other viral diseases: Secondary | ICD-10-CM | POA: Diagnosis not present

## 2015-02-27 LAB — BASIC METABOLIC PANEL
BUN: 16 mg/dL (ref 6–23)
CHLORIDE: 105 meq/L (ref 96–112)
CO2: 28 mEq/L (ref 19–32)
Calcium: 9.2 mg/dL (ref 8.4–10.5)
Creatinine, Ser: 0.82 mg/dL (ref 0.40–1.50)
GFR: 101.48 mL/min (ref >60.00)
Glucose, Bld: 118 mg/dL — ABNORMAL HIGH (ref 70–99)
POTASSIUM: 4.5 meq/L (ref 3.5–5.1)
Sodium: 141 mEq/L (ref 135–145)

## 2015-02-27 LAB — PSA: PSA: 1.81 ng/mL (ref 0.10–4.00)

## 2015-02-28 ENCOUNTER — Other Ambulatory Visit: Payer: Self-pay | Admitting: Family Medicine

## 2015-02-28 LAB — HEPATITIS C ANTIBODY: HCV Ab: NEGATIVE

## 2015-05-22 ENCOUNTER — Other Ambulatory Visit: Payer: 59

## 2015-05-23 ENCOUNTER — Other Ambulatory Visit (INDEPENDENT_AMBULATORY_CARE_PROVIDER_SITE_OTHER): Payer: 59

## 2015-05-23 DIAGNOSIS — Z0000 Encounter for general adult medical examination without abnormal findings: Secondary | ICD-10-CM | POA: Diagnosis not present

## 2015-05-23 LAB — CBC WITH DIFFERENTIAL/PLATELET
BASOS ABS: 0 10*3/uL (ref 0.0–0.1)
Basophils Relative: 0.5 % (ref 0.0–3.0)
EOS ABS: 0.2 10*3/uL (ref 0.0–0.7)
Eosinophils Relative: 2.7 % (ref 0.0–5.0)
HEMATOCRIT: 41.7 % (ref 39.0–52.0)
Hemoglobin: 14.3 g/dL (ref 13.0–17.0)
LYMPHS PCT: 22.4 % (ref 12.0–46.0)
Lymphs Abs: 1.7 10*3/uL (ref 0.7–4.0)
MCHC: 34.2 g/dL (ref 30.0–36.0)
MCV: 92.8 fl (ref 78.0–100.0)
MONOS PCT: 9.2 % (ref 3.0–12.0)
Monocytes Absolute: 0.7 10*3/uL (ref 0.1–1.0)
NEUTROS PCT: 65.2 % (ref 43.0–77.0)
Neutro Abs: 5.1 10*3/uL (ref 1.4–7.7)
PLATELETS: 232 10*3/uL (ref 150.0–400.0)
RBC: 4.5 Mil/uL (ref 4.22–5.81)
RDW: 13.7 % (ref 11.5–15.5)
WBC: 7.8 10*3/uL (ref 4.0–10.5)

## 2015-05-23 LAB — LIPID PANEL
CHOL/HDL RATIO: 3
Cholesterol: 140 mg/dL (ref 0–200)
HDL: 44.3 mg/dL (ref >39.00)
LDL Cholesterol: 78 mg/dL (ref 0–99)
NONHDL: 95.47
Triglycerides: 89 mg/dL (ref 0.0–149.0)
VLDL: 17.8 mg/dL (ref 0.0–40.0)

## 2015-05-23 LAB — POC URINALSYSI DIPSTICK (AUTOMATED)
BILIRUBIN UA: NEGATIVE
GLUCOSE UA: NEGATIVE
KETONES UA: NEGATIVE
Leukocytes, UA: NEGATIVE
Nitrite, UA: NEGATIVE
Protein, UA: NEGATIVE
RBC UA: NEGATIVE
SPEC GRAV UA: 1.025
UROBILINOGEN UA: 0.2
pH, UA: 5.5

## 2015-05-23 LAB — BASIC METABOLIC PANEL
BUN: 14 mg/dL (ref 6–23)
CALCIUM: 9.2 mg/dL (ref 8.4–10.5)
CHLORIDE: 104 meq/L (ref 96–112)
CO2: 27 meq/L (ref 19–32)
CREATININE: 0.89 mg/dL (ref 0.40–1.50)
GFR: 92.25 mL/min (ref >60.00)
GLUCOSE: 109 mg/dL — AB (ref 70–99)
POTASSIUM: 4.9 meq/L (ref 3.5–5.1)
Sodium: 138 mEq/L (ref 135–145)

## 2015-05-23 LAB — HEPATIC FUNCTION PANEL
ALBUMIN: 4.4 g/dL (ref 3.5–5.2)
ALK PHOS: 39 U/L (ref 39–117)
ALT: 30 U/L (ref 0–53)
AST: 18 U/L (ref 0–37)
BILIRUBIN DIRECT: 0.2 mg/dL (ref 0.0–0.3)
Total Bilirubin: 0.8 mg/dL (ref 0.2–1.2)
Total Protein: 6.4 g/dL (ref 6.0–8.3)

## 2015-05-23 LAB — PSA: PSA: 1.53 ng/mL (ref 0.10–4.00)

## 2015-05-23 LAB — TSH: TSH: 1.57 u[IU]/mL (ref 0.35–4.50)

## 2015-05-28 ENCOUNTER — Encounter: Payer: Self-pay | Admitting: Family Medicine

## 2015-05-28 ENCOUNTER — Ambulatory Visit (INDEPENDENT_AMBULATORY_CARE_PROVIDER_SITE_OTHER): Payer: 59 | Admitting: Family Medicine

## 2015-05-28 VITALS — BP 122/82 | HR 75 | Temp 98.1°F | Ht 67.0 in | Wt 219.0 lb

## 2015-05-28 DIAGNOSIS — Z0000 Encounter for general adult medical examination without abnormal findings: Secondary | ICD-10-CM

## 2015-05-28 LAB — POCT GLYCOSYLATED HEMOGLOBIN (HGB A1C): HEMOGLOBIN A1C: 5.5

## 2015-05-28 MED ORDER — ZOSTER VACCINE LIVE 19400 UNT/0.65ML ~~LOC~~ SUSR: 0.6500 mL | Freq: Once | SUBCUTANEOUS | Status: DC

## 2015-05-28 MED ORDER — SIMVASTATIN 80 MG PO TABS: ORAL_TABLET | ORAL | Status: DC

## 2015-05-28 MED ORDER — LOSARTAN POTASSIUM 100 MG PO TABS: ORAL_TABLET | ORAL | Status: DC

## 2015-05-28 NOTE — Progress Notes (Signed)
Phone: [phone]  Subjective:  Patient presents today for their annual physical. Chief complaint-noted.   See problem oriented charting- ROS- full  review of systems was completed and negative including No chest pain or shortness of breath. No headache or blurry vision.   The following were reviewed and entered/updated in epic: Past Medical History  Diagnosis Date  . Hyperlipidemia   . Hypertension   . Allergy    Patient Active Problem List   Diagnosis Date Noted  . Hyperglycemia 02/04/2015    Priority: Medium  . OSA on CPAP 05/28/2012    Priority: Medium  . Essential hypertension 03/25/2007    Priority: Medium  . Hyperlipidemia 07/29/2006    Priority: Medium  . Former smoker 03/06/2014    Priority: Low  . Insomnia 03/06/2014    Priority: Low  . Allergic rhinitis 06/21/2009    Priority: Low   Past Surgical History  Procedure Laterality Date  . Appendectomy    . Sinus irrigation  2003  . Cataract lens implants      bilateral    Family History  Problem Relation Age of Onset  . Heart attack Father     59s, heavy smoker  . Diabetes Brother     x2  . Nephrolithiasis Daughter   . GER disease Mother   . Hypertension Brother     x2    Medications- reviewed and updated Current Outpatient Prescriptions  Medication Sig Dispense Refill  . losartan (COZAAR) 100 MG tablet Take one tablet by mouth daily. 30 tablet 5  . Multiple Vitamin (MULTIVITAMIN) tablet Take 1 tablet by mouth daily.      . simvastatin (ZOCOR) 80 MG tablet TAKE 0.5 TABLETS (40 MG TOTAL) BY MOUTH AT BEDTIME. 45 tablet 6  . Zoster Vaccine Live, PF, (ZOSTAVAX) 29562 UNT/0.65ML injection Inject 19,400 Units into the skin once. 1 each 0   No current facility-administered medications for this visit.    Allergies-reviewed and updated No Known Allergies  Social History   Social History  . Marital Status: Married    Spouse Name: N/A  . Number of Children: N/A  . Years of Education: N/A    Social History Main Topics  . Smoking status: Current Some Day Smoker -- 0.75 packs/day for 4 years    Types: Cigarettes, Cigars    Last Attempt to Quit: 03/11/1997  . Smokeless tobacco: Never Used  . Alcohol Use: 6.0 oz/week    10 Standard drinks or equivalent per week  . Drug Use: No  . Sexual Activity: Not Asked   Other Topics Concern  . None   Social History Narrative   Married (wife patient outside Network engineer) 3 children, 1 step child. 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       Hobbies: formerly played hockey-wants to get back in, golf, plays music    Objective: BP 122/82 mmHg  Pulse 75  Temp(Src) 98.1 F (36.7 C) (Oral)  Ht 5\' 7"  (1.702 m)  Wt 219 lb (99.338 kg)  BMI 34.29 kg/m2  SpO2 96% Gen: NAD, resting comfortably HEENT: Mucous membranes are moist. Oropharynx normal Neck: no thyromegaly CV: RRR no murmurs rubs or gallops Lungs: CTAB no crackles, wheeze, rhonchi Abdomen: soft/nontender/nondistended/normal bowel sounds. No rebound or guarding.  Rectal: normal tone, normal sizeprostate, no masses or tenderness Ext: no edema Skin: warm, dry Neuro: grossly normal, moves all extremities, PERRLA  Assessment/Plan:  60 y.o. male presenting for annual physical.  Health Maintenance counseling: 1.  Anticipatory guidance: Patient counseled regarding regular dental exams, eye exams, wearing seatbelts.  2. Risk factor reduction:  Advised patient of need for regular exercise and diet rich and fruits and vegetables to reduce risk of heart attack and stroke. Walking 2x a week, just got bike out last week. IMproving diet bc wife is prediabetic as well- working to reduce both of their risk.  3. Immunizations/screenings/ancillary studies Immunization History  Administered Date(s) Administered  . Influenza Split 11/04/2010, 11/04/2011  . Influenza Whole 12/07/2006, 11/05/2008, 10/17/2009  . Influenza,inj,Quad PF,36+ Mos 11/30/2012  . Influenza-Unspecified  12/16/2013, 10/02/2014  . Td 06/06/2006   Health Maintenance Due  Topic Date Due  . ZOSTAVAX - reprinted prescription. To call when gets vaccine.  01/09/2014   4. Prostate cancer screening- low risk based on psa trend and rectal  Lab Results  Component Value Date   PSA 1.53 05/23/2015   PSA 1.81 02/27/2015   PSA 1.45 08/20/2010   5. Colon cancer screening - 05/2008 with 10 year repeat so 2020 6. Skin cancer screening- will let me know if wants dermatology referral. Has one spot on right leg that appears to be wart- discussed salicylic acid treatment through [facility]. If enlarges- return to care   Status of chronic or acute concerns  Hypertension- controlled on losartan 100mg  BP Readings from Last 3 Encounters:  05/28/15 122/82  02/04/15 132/92  03/06/14 120/80   Hyperlipidemia- controlled on simvastatin 40mg  Lab Results  Component Value Date   CHOL 140 05/23/2015   HDL 44.30 05/23/2015   LDLCALC 78 05/23/2015   TRIG 89.0 05/23/2015   CHOLHDL 3 05/23/2015   Hyperglycemia- increased risk for DM. Has lost 5 lbs since last visit. Repeat a1c to be input later was 5.5.  Lab Results  Component Value Date   HGBA1C 5.9 02/21/2015   Wt Readings from Last 3 Encounters:  05/28/15 219 lb (99.338 kg)  02/04/15 224 lb (101.606 kg)  03/06/14 222 lb (100.699 kg)   Discussed considering aspirin Return in about 6 months (around 11/28/2015) for check on blood pressure and a1c. Return precautions advised.   Meds ordered this encounter  Medications  . Zoster Vaccine Live, PF, (ZOSTAVAX) 29562 UNT/0.65ML injection    Sig: Inject 19,400 Units into the skin once.    Dispense:  1 each    Refill:  0  . losartan (COZAAR) 100 MG tablet    Sig: Take one tablet by mouth daily.    Dispense:  90 tablet    Refill:  3    No refills available  . simvastatin (ZOCOR) 80 MG tablet    Sig: TAKE 0.5 TABLETS (40 MG TOTAL) BY MOUTH AT BEDTIME.    Dispense:  45 tablet    Refill:  3    No refills available     Garret Reddish, MD

## 2015-05-28 NOTE — Patient Instructions (Addendum)
Blood pressure looks great  Cholesterol looks great  Diabetes risk has decreased with your weight loss with a1c down to 5.5 from 5.9. [place] losing 5 lbs. Would set a goal for at least another 5 lbs in 6 months. The more loss the better for longterm health.   Health Maintenance Due  Topic Date Due  . ZOSTAVAX  01/09/2014  let us know when you get shingles vaccination  Meds ordered this encounter  Medications  . Zoster Vaccine Live, PF, (ZOSTAVAX) [id number] UNT/0.65ML injection    Sig: Inject 19,400 Units into the skin once.    Dispense:  1 each    Refill:  0  . losartan (COZAAR) 100 MG tablet    Sig: Take one tablet by mouth daily.    Dispense:  90 tablet    Refill:  3    No refills available  . simvastatin (ZOCOR) 80 MG tablet    Sig: TAKE 0.5 TABLETS (40 MG TOTAL) BY MOUTH AT BEDTIME.    Dispense:  45 tablet    Refill:  3    No refills available   Have we discussed aspirin?

## 2016-03-13 ENCOUNTER — Ambulatory Visit (INDEPENDENT_AMBULATORY_CARE_PROVIDER_SITE_OTHER): Payer: 59 | Admitting: Family Medicine

## 2016-03-13 ENCOUNTER — Encounter: Payer: Self-pay | Admitting: Adult Health

## 2016-03-13 VITALS — BP 120/78 | Temp 98.2°F | Ht 67.0 in | Wt 226.8 lb

## 2016-03-13 DIAGNOSIS — R079 Chest pain, unspecified: Secondary | ICD-10-CM

## 2016-03-13 DIAGNOSIS — R739 Hyperglycemia, unspecified: Secondary | ICD-10-CM

## 2016-03-13 DIAGNOSIS — R5383 Other fatigue: Secondary | ICD-10-CM | POA: Diagnosis not present

## 2016-03-13 DIAGNOSIS — Z9989 Dependence on other enabling machines and devices: Secondary | ICD-10-CM

## 2016-03-13 DIAGNOSIS — G4733 Obstructive sleep apnea (adult) (pediatric): Secondary | ICD-10-CM | POA: Diagnosis not present

## 2016-03-13 LAB — CBC WITH DIFFERENTIAL/PLATELET
Basophils Absolute: 0 cells/uL (ref 0–200)
Basophils Relative: 0 %
EOS PCT: 2 %
Eosinophils Absolute: 160 cells/uL (ref 15–500)
HCT: 42.5 % (ref 38.5–50.0)
HEMOGLOBIN: 14.2 g/dL (ref 13.2–17.1)
LYMPHS ABS: 2160 {cells}/uL (ref 850–3900)
LYMPHS PCT: 27 %
MCH: 31.1 pg (ref 27.0–33.0)
MCHC: 33.4 g/dL (ref 32.0–36.0)
MCV: 93 fL (ref 80.0–100.0)
MPV: 10.2 fL (ref 7.5–12.5)
Monocytes Absolute: 1040 cells/uL — ABNORMAL HIGH (ref 200–950)
Monocytes Relative: 13 %
NEUTROS PCT: 58 %
Neutro Abs: 4640 cells/uL (ref 1500–7800)
Platelets: 271 10*3/uL (ref 140–400)
RBC: 4.57 MIL/uL (ref 4.20–5.80)
RDW: 13.4 % (ref 11.0–15.0)
WBC: 8 10*3/uL (ref 3.8–10.8)

## 2016-03-13 NOTE — Progress Notes (Signed)
Subjective:  Scott Neal is a 61 y.o. year old very pleasant male patient who presents for/with See problem oriented charting ROS- No shortness of breath, diaphoresis, no left arm or neck pain, no dizziness, no palpitations.   Past Medical History-  Patient Active Problem List   Diagnosis Date Noted  . Hyperglycemia 02/04/2015    Priority: Medium  . OSA on CPAP 05/28/2012    Priority: Medium  . Essential hypertension 03/25/2007    Priority: Medium  . Hyperlipidemia 07/29/2006    Priority: Medium  . Former smoker 03/06/2014    Priority: Low  . Insomnia 03/06/2014    Priority: Low  . Allergic rhinitis 06/21/2009    Priority: Low    Medications- reviewed and updated Current Outpatient Prescriptions  Medication Sig Dispense Refill  . losartan (COZAAR) 100 MG tablet Take one tablet by mouth daily. 90 tablet 3  . Multiple Vitamin (MULTIVITAMIN) tablet Take 1 tablet by mouth daily.      . simvastatin (ZOCOR) 80 MG tablet TAKE 0.5 TABLETS (40 MG TOTAL) BY MOUTH AT BEDTIME. 45 tablet 3  . Zoster Vaccine Live, PF, (ZOSTAVAX) 30160 UNT/0.65ML injection Inject 19,400 Units into the skin once. 1 each 0   No current facility-administered medications for this visit.     Objective: BP 120/78 (BP Location: Left Arm, Patient Position: Sitting)   Temp 98.2 F (36.8 C) (Oral)   Ht 5\' 7"  (1.702 m)   Wt 226 lb 12.8 oz (102.9 kg)   BMI 35.52 kg/m  Gen: NAD, resting comfortably CV: RRR no murmurs rubs or gallops Lungs: CTAB no crackles, wheeze, rhonchi Abdomen: soft/nontender/nondistended/normal bowel sounds. No rebound or guarding.  Ext: no edema Skin: warm, dry Neuro: grossly normal, moves all extremities  EKG: sinus rhythm. Rate 75. Normal axis, normal intervals, no hypertrophy. Some flattening in III but otherwise no st or t wave changes. Compared to 09/03/2006 ekg unchanged- had flattening in III at that time as well.   Assessment/Plan:  Chest pain, unspecified type  S: has been  feeling fatigued for several months. Feels like he needs to take naps. cataracts 2 years ago and did follow up last week because he felt like his eyes were getting tired.  Has felt like he sweats more than he would like to. Wants to retrial cpap 30 days- he gave away his old machine to a family member who was in tight financial position.   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   This morning felt a sharp pain in front left chest then radiated to left shoulder blade. Chest pain was very quick seconds but shoulder blade pain has lingered through the day. No shortness of breath, diaphoresis, no left arm or neck pain, no dizziness, no palpitations. Pain in his shoulder about a 2/10 soreness.    Was in the kitchen eating an egg and just standing at sink. Was not exerting himself.  A/P: 61 year old patient with HLD (controlled), hyperglycemia, HTN (controlled), family history CAD in father in 35s presents with chest pain. Atypical chest pain (sharp, left sided, not with exertion, not relieved by rest, lasting only seconds). Occurred while standing and eating an egg- ? Esophageal spasm or reflux potentially. EKG reassuring and we agreed to pursue further workup only if new or recurrent or associated symptoms. With only midl lingering shoulder blade pain and equal upper extremity pulses- doubt something like dissection.   For fatigue and chest pain- will update labs  to include a1c, cbc, cmp, tsh. Could add hyperglycemia for a1c if needed in future or fatigue for cbc, cmp, tsh. These were entered only under chest pain in error.   OSA on CPAP S: was unable to tolerate full facemask when tested/treated back in 2013/2014. He gave away equipment as noted that he had bought. He is having more fatigue, daytime sleepiness taking naps A/P: refer back to pulmonology. He wants to trial nasal cpap. I do suspect his fatigue is caused by his known OSA.    Orders Placed This Encounter  Procedures  . EKG  12-Lead   Return precautions advised. Extensive counseling on urgent and emergent return precautions. He will update me if symptoms linger over next few weeks as well.  Garret Reddish, MD

## 2016-03-13 NOTE — Assessment & Plan Note (Signed)
S: was unable to tolerate full facemask when tested/treated back in 2013/2014. He gave away equipment as noted that he had bought. He is having more fatigue, daytime sleepiness taking naps A/P: refer back to pulmonology. He wants to trial nasal cpap. I do suspect his fatigue is caused by his known OSA.

## 2016-03-13 NOTE — Patient Instructions (Signed)
Please stop by lab before you go  Please let me know if you have recurrence of chest pain immediately. Does not sound cardiac and EKG very reassuring.   We will call you within a week or two about your referral to pulmonology. If you do not hear within 3 weeks, give [place] a call.

## 2016-03-14 LAB — HEMOGLOBIN A1C
Hgb A1c MFr Bld: 5.4 % (ref <5.7)
Mean Plasma Glucose: 108 mg/dL

## 2016-03-14 LAB — TSH: TSH: 1.88 m[IU]/L (ref 0.40–4.50)

## 2016-05-28 ENCOUNTER — Ambulatory Visit (INDEPENDENT_AMBULATORY_CARE_PROVIDER_SITE_OTHER): Payer: 59 | Admitting: Internal Medicine

## 2016-05-28 ENCOUNTER — Encounter: Payer: Self-pay | Admitting: Internal Medicine

## 2016-05-28 VITALS — BP 122/82 | HR 76 | Resp 16 | Ht 67.0 in | Wt 227.8 lb

## 2016-05-28 DIAGNOSIS — F5101 Primary insomnia: Secondary | ICD-10-CM

## 2016-05-28 DIAGNOSIS — G4733 Obstructive sleep apnea (adult) (pediatric): Secondary | ICD-10-CM

## 2016-05-28 DIAGNOSIS — J3089 Other allergic rhinitis: Secondary | ICD-10-CM | POA: Diagnosis not present

## 2016-05-28 DIAGNOSIS — J302 Other seasonal allergic rhinitis: Secondary | ICD-10-CM

## 2016-05-28 NOTE — Assessment & Plan Note (Signed)
He describes fragmented sleep, not sufficiently restoring. We will wait to see how well successful treatment of sleep apnea addresses this issue before deciding if he needs additional help.

## 2016-05-28 NOTE — Progress Notes (Signed)
05/28/16-61 year old male smoker for sleep medicine evaluation-Patient referred by Garret Reddish MD  patient bought a CPAP at original diagnosis but wasn't working with a sleep physician and didn't like it and so he gave it to his cousin  NPSG 12/27/11- AHI 15.7/ hr, desaturation to 82%, body weight 200 lbs. CPAP to 12 cwp Unable to tolerate CPAP at that time. Medical problem list includes allergic rhinitis, HBP, insomnia Original symptoms persist-loud snoring, witnessed apnea, daytime sleepiness requiring naps. Sleep quality is restless-melatonin did not help. Occasional leg cramps. 2 or 3 cups of morning coffee. ENT surgery-nasal polyps 1999. He is not aware of heart or lung disease. Treated for hypertension and seasonal allergic rhinitis. Epworth score 10/24  Prior to Admission medications   Medication Sig Start Date End Date Taking? Authorizing Provider  losartan (COZAAR) 100 MG tablet Take one tablet by mouth daily. 05/28/15  Yes Marin Olp, MD  Multiple Vitamin (MULTIVITAMIN) tablet Take 1 tablet by mouth daily.     Yes [provider]  simvastatin (ZOCOR) 80 MG tablet TAKE 0.5 TABLETS (40 MG TOTAL) BY MOUTH AT BEDTIME. 05/28/15  Yes Marin Olp, MD  Zoster Vaccine Live, PF, (ZOSTAVAX) 32202 UNT/0.65ML injection Inject 19,400 Units into the skin once. Patient not taking: Reported on 05/28/2016 05/28/15   Marin Olp, MD   Past Medical History:  Diagnosis Date  . Allergy   . Hyperlipidemia   . Hypertension    Past Surgical History:  Procedure Laterality Date  . APPENDECTOMY    . cataract lens implants     bilateral  . SINUS IRRIGATION  2003   Family History  Problem Relation Age of Onset  . Heart attack Father        22s, heavy smoker  . Diabetes Brother        x2  . Nephrolithiasis Daughter   . GER disease Mother   . Hypertension Brother        x2   Social History   Social History  . Marital status: Married    Spouse name: N/A  . Number of  children: N/A  . Years of education: N/A   Occupational History  . Not on file.   Social History Main Topics  . Smoking status: Current Some Day Smoker    Packs/day: 0.75    Years: 4.00    Types: Cigarettes, Cigars    Last attempt to quit: 03/11/1997  . Smokeless tobacco: Never Used  . Alcohol use 6.0 oz/week    10 Standard drinks or equivalent per week  . Drug use: No  . Sexual activity: Not on file   Other Topics Concern  . Not on file   Social History Narrative   Married (wife patient outside practice) 3 children, 1 step child. 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       Hobbies: formerly played hockey-wants to get back in, golf, plays music   ROS-see HPI   Negative unless "+" Constitutional:    weight loss, night sweats, fevers, chills, + fatigue, lassitude. HEENT:    headaches, difficulty swallowing, tooth/dental problems, sore throat,       sneezing, itching, ear ache, nasal congestion, post nasal drip, snoring CV:    chest pain, orthopnea, PND, swelling in lower extremities, anasarca,  dizziness, palpitations Resp:   shortness of breath with exertion or at rest.                productive cough,   non-productive cough, coughing up of blood.              change in color of mucus.  wheezing.   Skin:    rash or lesions. GI:  No-   heartburn, indigestion, abdominal pain, nausea, vomiting, diarrhea,                 change in bowel habits, loss of appetite GU: dysuria, change in color of urine, no urgency or frequency.   flank pain. MS:   joint pain, stiffness, decreased range of motion, back pain. Neuro-     nothing unusual Psych:  change in mood or affect.  depression or anxiety.   memory loss.  OBJ- Physical Exam General- Alert, Oriented, Affect-appropriate, Distress- none acute Skin- rash-none, lesions- none, excoriation- none Lymphadenopathy- none Head- atraumatic            Eyes- Gross vision  intact, PERRLA, conjunctivae and secretions clear, + overweight            Ears- Hearing, canals-normal            Nose- Clear, no-Septal dev, mucus, polyps, erosion, perforation             Throat- Mallampati III , mucosa clear , drainage- none, tonsils- atrophic Neck- flexible , trachea midline, no stridor , thyroid nl, carotid no bruit Chest - symmetrical excursion , unlabored           Heart/CV- RRR , no murmur , no gallop  , no rub, nl s1 s2                           - JVD- none , edema- none, stasis changes- none, varices- none           Lung- clear to P&A, wheeze- none, cough- none , dullness-none, rub- none           Chest wall-  Abd-  Br/ Gen/ Rectal- Not done, not indicated Extrem- cyanosis- none, clubbing, none, atrophy- none, strength- nl Neuro- grossly intact to observation

## 2016-05-28 NOTE — Assessment & Plan Note (Signed)
He doesn't usually feel he needs treatment. There has been no evident recurrence of nasal polyps resected almost 20 years ago. A nasal steroid inhaler and nonsedating antihistamine would be appropriate choices if needed.

## 2016-05-28 NOTE — Assessment & Plan Note (Signed)
He has gained weight since his original diagnostic sleep study in 2013 and has persistent symptoms. There are no compounding factors so an unattended home sleep test should be sufficient for documentation. I discussed available current treatments and he indicates that he would like to look into an oral appliance rather than CPAP. We will wait for results of his sleep study and look at options then. Meanwhile weight loss, sleeping off flat of back, responsibility for safe and alert driving.

## 2016-05-28 NOTE — Patient Instructions (Signed)
Order- schedule unattended home sleep test     Dx OSA  My staff will set up a return visit based on  when your study is done

## 2016-06-13 ENCOUNTER — Other Ambulatory Visit: Payer: Self-pay | Admitting: Family Medicine

## 2016-07-04 DIAGNOSIS — G4733 Obstructive sleep apnea (adult) (pediatric): Secondary | ICD-10-CM | POA: Diagnosis not present

## 2016-07-07 ENCOUNTER — Other Ambulatory Visit: Payer: Self-pay | Admitting: *Deleted

## 2016-07-07 DIAGNOSIS — G4733 Obstructive sleep apnea (adult) (pediatric): Secondary | ICD-10-CM | POA: Diagnosis not present

## 2016-09-19 ENCOUNTER — Other Ambulatory Visit: Payer: Self-pay | Admitting: Family Medicine

## 2016-09-30 ENCOUNTER — Ambulatory Visit (INDEPENDENT_AMBULATORY_CARE_PROVIDER_SITE_OTHER): Payer: 59 | Admitting: *Deleted

## 2016-09-30 DIAGNOSIS — Z23 Encounter for immunization: Secondary | ICD-10-CM | POA: Diagnosis not present

## 2016-10-16 NOTE — Progress Notes (Signed)
ATC pt x 2, line disconnected. WCB.

## 2016-10-22 ENCOUNTER — Telehealth: Payer: Self-pay | Admitting: Internal Medicine

## 2016-10-22 DIAGNOSIS — G4733 Obstructive sleep apnea (adult) (pediatric): Secondary | ICD-10-CM

## 2016-10-22 NOTE — Telephone Encounter (Signed)
Notes recorded by Deneise Lever, MD on 10/12/2016 at 2:07 PM EDT Home sleep test showed that he has severe obstructive sleep apnea, averaging 36 apneas/ hour, with drops in blood oxygen level.   I recommend we order new DME, new CPAP auto 5-20, mask of choice, humidifier, supplies, AirView  Dx OSA  Please make sure he has a return ov with in 31-90 days per insurance regs   Pt is traveling at this time and will not be able to get CPAP set up until 11-11-16. I have placed this information in the order as well as a reminder to call patient to set up 31-90 day follow up.

## 2016-11-17 ENCOUNTER — Other Ambulatory Visit: Payer: Self-pay | Admitting: Family Medicine

## 2016-12-21 ENCOUNTER — Other Ambulatory Visit: Payer: Self-pay | Admitting: Family Medicine

## 2017-05-17 DIAGNOSIS — H8111 Benign paroxysmal vertigo, right ear: Secondary | ICD-10-CM | POA: Diagnosis not present

## 2017-05-17 DIAGNOSIS — H8113 Benign paroxysmal vertigo, bilateral: Secondary | ICD-10-CM | POA: Diagnosis not present

## 2017-06-01 ENCOUNTER — Other Ambulatory Visit: Payer: Self-pay | Admitting: Family Medicine

## 2017-06-08 ENCOUNTER — Encounter: Payer: Self-pay | Admitting: Family Medicine

## 2017-06-08 ENCOUNTER — Ambulatory Visit: Payer: 59 | Admitting: Family Medicine

## 2017-06-08 VITALS — BP 120/76 | HR 79 | Temp 98.3°F | Ht 67.0 in | Wt 226.6 lb

## 2017-06-08 DIAGNOSIS — J329 Chronic sinusitis, unspecified: Secondary | ICD-10-CM

## 2017-06-08 DIAGNOSIS — Z23 Encounter for immunization: Secondary | ICD-10-CM

## 2017-06-08 DIAGNOSIS — J3089 Other allergic rhinitis: Secondary | ICD-10-CM | POA: Diagnosis not present

## 2017-06-08 DIAGNOSIS — B9689 Other specified bacterial agents as the cause of diseases classified elsewhere: Secondary | ICD-10-CM | POA: Diagnosis not present

## 2017-06-08 DIAGNOSIS — J302 Other seasonal allergic rhinitis: Secondary | ICD-10-CM

## 2017-06-08 MED ORDER — AMOXICILLIN-POT CLAVULANATE 875-125 MG PO TABS: 1.0000 | ORAL_TABLET | Freq: Two times a day (BID) | ORAL | 0 refills | Status: DC

## 2017-06-08 NOTE — Progress Notes (Signed)
PCP: Marin Olp, MD  Subjective:  Scott Neal is a 62 y.o. year old very pleasant male patient who presents with sinusitis symptoms including nasal congestion, sinus tenderness.  -other symptoms include: Since easter- Fullness in R ear. Saw Dr. Constance Holster. Had epley maneuver done- thought it was his vertigo. Wouldn't let up and developed other symptoms including sinus pressure. R ear-  Feels full, pressure, like water behind him. Yellow discharge from nose. Has not been on antibiotics. Has been on echinacea and allegra and other options, turmeric -day of illness:over 6 weeks- worst over At least 3 weeks.  -Symptoms are largely stable but just will not improve -previous treatments: see above -sick contacts/travel/risks: denies flu exposure.  -Hx of: allergies  ROS-very mild  SOB. Some dizziness particularly with standing but can be anytime. Has had fatigue. No fever. Has had some chills.   Pertinent Past Medical History-  Patient Active Problem List   Diagnosis Date Noted  . Hyperglycemia 02/04/2015    Priority: Medium  . Obstructive sleep apnea 05/28/2012    Priority: Medium  . Essential hypertension 03/25/2007    Priority: Medium  . Hyperlipidemia 07/29/2006    Priority: Medium  . Former smoker 03/06/2014    Priority: Low  . Insomnia 03/06/2014    Priority: Low  . Seasonal and perennial allergic rhinitis 06/21/2009    Priority: Low    Medications- reviewed  Current Outpatient Medications  Medication Sig Dispense Refill  . losartan (COZAAR) 100 MG tablet TAKE ONE TABLET BY MOUTH DAILY 30 tablet 5  . Multiple Vitamin (MULTIVITAMIN) tablet Take 1 tablet by mouth daily.      . simvastatin (ZOCOR) 80 MG tablet TAKE ONE-HALF TABLET BY MOUTH AT BEDTIME 15 tablet 0  . Zoster Vaccine Live, PF, (ZOSTAVAX) 38182 UNT/0.65ML injection Inject 19,400 Units into the skin once. (Patient not taking: Reported on 05/28/2016) 1 each 0   No current facility-administered medications for this  visit.     Objective: BP 120/76 (BP Location: Left Arm, Patient Position: Sitting, Cuff Size: Large)   Pulse 79   Temp 98.3 F (36.8 C) (Oral)   Ht 5\' 7"  (1.702 m)   Wt 226 lb 9.6 oz (102.8 kg)   SpO2 94%   BMI 35.49 kg/m  Gen: NAD, appears fatigued HEENT: Turbinates erythematous with yellow drainage, TM normal, pharynx mildly erythematous with no tonsilar exudate or edema CV: RRR no murmurs rubs or gallops Lungs: CTAB no crackles, wheeze, rhonchi Ext: no edema Skin: warm, dry, no rash  Assessment/Plan:  Sinsusitis Bacterial based on: Symptoms >10 days. Has a lot of ear pressure - no clear infection- likely related to sinus pressures.   Treatment: -considered steroid: we opted out for now- he will contact me if not doing better in 10-14 days and we can add on prednisone at that time -other symptomatic care with mucinex -Antibiotic indicated: yes, see below  Seasonal and perennial allergic rhinitis We discussed potential role of allergies. He is on allegra and will continue. Discussed prednisone could help allergic element if antibiotic doesn't resolve symptoms.   Finally, we reviewed reasons to return to care including if symptoms worsen or persist or new concerns arise (particularly fever or shortness of breath)  Meds ordered this encounter  Medications  . amoxicillin-clavulanate (AUGMENTIN) 875-125 MG tablet    Sig: Take 1 tablet by mouth 2 (two) times daily.    Dispense:  20 tablet    Refill:  0    Garret Reddish, MD

## 2017-06-08 NOTE — Assessment & Plan Note (Signed)
We discussed potential role of allergies. He is on allegra and will continue. Discussed prednisone could help allergic element if antibiotic doesn't resolve symptoms.

## 2017-06-08 NOTE — Patient Instructions (Signed)
Sinsusitis Bacterial based on: Symptoms >10 days. Has a lot of ear pressure - no clear infection- likely related to sinus pressures.   Treatment: -considered steroid: we opted out for now- he will contact me if not doing better in 10-14 days and we can add on prednisone at that time -other symptomatic care with mucinex -Antibiotic indicated: yes, see below  Finally, we reviewed reasons to return to care including if symptoms worsen or persist or new concerns arise (particularly fever or shortness of breath)  Meds ordered this encounter  Medications  . amoxicillin-clavulanate (AUGMENTIN) 875-125 MG tablet    Sig: Take 1 tablet by mouth 2 (two) times daily.    Dispense:  20 tablet    Refill:  0

## 2017-07-02 ENCOUNTER — Other Ambulatory Visit: Payer: Self-pay | Admitting: Family Medicine

## 2017-07-06 MED ORDER — SIMVASTATIN 80 MG PO TABS: 40.0000 mg | ORAL_TABLET | Freq: Every day | ORAL | 0 refills | Status: DC

## 2017-07-06 NOTE — Telephone Encounter (Signed)
Spoke to pt, told him I will send Rx for Simvastatin to the pharmacy enough till appt on 7/18 with Dr. Yong Channel. Pt verbalized understanding. Rx sent.

## 2017-07-06 NOTE — Addendum Note (Signed)
Addended by: Marian Sorrow on: 07/06/2017 04:02 PM   Modules accepted: Orders

## 2017-07-06 NOTE — Telephone Encounter (Signed)
Pt called in to follow up on his refill request. Pt was told by pharmacy that an apt is needed for further refills. Pt has scheduled an apt for 07/22/17. Pt says that he is completely out of his medication, he would like to know if provider could provide him with medication until his apt?    Pharmacy: [facility], [place]

## 2017-07-06 NOTE — Telephone Encounter (Signed)
See note

## 2017-07-22 ENCOUNTER — Other Ambulatory Visit: Payer: Self-pay | Admitting: Family Medicine

## 2017-07-22 ENCOUNTER — Ambulatory Visit: Payer: 59 | Admitting: Family Medicine

## 2017-07-23 ENCOUNTER — Ambulatory Visit: Payer: 59 | Admitting: Family Medicine

## 2017-07-23 ENCOUNTER — Encounter: Payer: Self-pay | Admitting: Family Medicine

## 2017-07-23 VITALS — BP 128/78 | HR 81 | Temp 98.3°F | Ht 67.0 in | Wt 221.2 lb

## 2017-07-23 DIAGNOSIS — H8103 Meniere's disease, bilateral: Secondary | ICD-10-CM

## 2017-07-23 DIAGNOSIS — Z125 Encounter for screening for malignant neoplasm of prostate: Secondary | ICD-10-CM

## 2017-07-23 DIAGNOSIS — I10 Essential (primary) hypertension: Secondary | ICD-10-CM | POA: Diagnosis not present

## 2017-07-23 DIAGNOSIS — E785 Hyperlipidemia, unspecified: Secondary | ICD-10-CM | POA: Diagnosis not present

## 2017-07-23 LAB — POC URINALSYSI DIPSTICK (AUTOMATED)
Bilirubin, UA: NEGATIVE
Blood, UA: NEGATIVE
GLUCOSE UA: NEGATIVE
KETONES UA: NEGATIVE
LEUKOCYTES UA: NEGATIVE
Nitrite, UA: NEGATIVE
PROTEIN UA: NEGATIVE
Spec Grav, UA: 1.025 (ref 1.010–1.025)
Urobilinogen, UA: 0.2 E.U./dL
pH, UA: 5.5 (ref 5.0–8.0)

## 2017-07-23 LAB — CBC
HEMATOCRIT: 41.8 % (ref 39.0–52.0)
Hemoglobin: 14.2 g/dL (ref 13.0–17.0)
MCHC: 34.1 g/dL (ref 30.0–36.0)
MCV: 94.8 fl (ref 78.0–100.0)
PLATELETS: 204 10*3/uL (ref 150.0–400.0)
RBC: 4.41 Mil/uL (ref 4.22–5.81)
RDW: 13.7 % (ref 11.5–15.5)
WBC: 5.4 10*3/uL (ref 4.0–10.5)

## 2017-07-23 LAB — COMPREHENSIVE METABOLIC PANEL
ALBUMIN: 4.5 g/dL (ref 3.5–5.2)
ALT: 34 U/L (ref 0–53)
AST: 19 U/L (ref 0–37)
Alkaline Phosphatase: 40 U/L (ref 39–117)
BUN: 16 mg/dL (ref 6–23)
CALCIUM: 9.4 mg/dL (ref 8.4–10.5)
CHLORIDE: 105 meq/L (ref 96–112)
CO2: 28 meq/L (ref 19–32)
CREATININE: 0.92 mg/dL (ref 0.40–1.50)
GFR: 88.16 mL/min (ref >60.00)
Glucose, Bld: 102 mg/dL — ABNORMAL HIGH (ref 70–99)
Potassium: 4.5 mEq/L (ref 3.5–5.1)
Sodium: 140 mEq/L (ref 135–145)
Total Bilirubin: 0.7 mg/dL (ref 0.2–1.2)
Total Protein: 6.7 g/dL (ref 6.0–8.3)

## 2017-07-23 LAB — PSA: PSA: 2.09 ng/mL (ref 0.10–4.00)

## 2017-07-23 LAB — LIPID PANEL
CHOL/HDL RATIO: 3
CHOLESTEROL: 160 mg/dL (ref 0–200)
HDL: 49.1 mg/dL (ref >39.00)
LDL CALC: 88 mg/dL (ref 0–99)
NonHDL: 110.69
TRIGLYCERIDES: 111 mg/dL (ref 0.0–149.0)
VLDL: 22.2 mg/dL (ref 0.0–40.0)

## 2017-07-23 MED ORDER — LOSARTAN POTASSIUM 100 MG PO TABS: 100.0000 mg | ORAL_TABLET | Freq: Every day | ORAL | 5 refills | Status: DC

## 2017-07-23 MED ORDER — LOSARTAN POTASSIUM 100 MG PO TABS: 100.0000 mg | ORAL_TABLET | Freq: Every day | ORAL | 3 refills | Status: DC

## 2017-07-23 MED ORDER — SIMVASTATIN 80 MG PO TABS: ORAL_TABLET | ORAL | 3 refills | Status: DC

## 2017-07-23 NOTE — Progress Notes (Signed)
Subjective:  Scott Neal is a 62 y.o. year old very pleasant male patient who presents for/with See problem oriented charting ROS- ear fullness, hearing loss, tinnitus noted. No fever or chills. Sinus pressure much improved.    Past Medical History-  Patient Active Problem List   Diagnosis Date Noted  . Hyperglycemia 02/04/2015    Priority: Medium  . Obstructive sleep apnea 05/28/2012    Priority: Medium  . Essential hypertension 03/25/2007    Priority: Medium  . Hyperlipidemia 07/29/2006    Priority: Medium  . Former smoker 03/06/2014    Priority: Low  . Insomnia 03/06/2014    Priority: Low  . Seasonal and perennial allergic rhinitis 06/21/2009    Priority: Low  . Meniere disease, bilateral 07/24/2017    Medications- reviewed and updated Current Outpatient Medications  Medication Sig Dispense Refill  . losartan (COZAAR) 100 MG tablet Take 1 tablet (100 mg total) by mouth daily. 90 tablet 3  . Multiple Vitamin (MULTIVITAMIN) tablet Take 1 tablet by mouth daily.      . simvastatin (ZOCOR) 80 MG tablet TAKE 1/2 TABLET BY MOUTH EVERY NIGHT AT BEDTIME 90 tablet 3   No current facility-administered medications for this visit.     Objective: BP 128/78   Pulse 81   Temp 98.3 F (36.8 C) (Oral)   Ht 5\' 7"  (1.702 m)   Wt 221 lb 3.2 oz (100.3 kg)   SpO2 95%   BMI 34.64 kg/m  Gen: NAD, resting comfortably TM normal, oropharynx normal, no sinus pressure CV: RRR no murmurs rubs or gallops Lungs: CTAB no crackles, wheeze, rhonchi Abdomen: soft/nontender/nondistended/normal bowel sounds.  Ext: no edema Skin: warm, dry  Assessment/Plan:  Essential hypertension S: controlled on losartan 100mg  BP Readings from Last 3 Encounters:  07/23/17 138/90  06/08/17 120/76  05/28/16 122/82  A/P: We discussed blood pressure goal of <140/90. Continue current meds  Hyperlipidemia S: well controlled on simvastatin 80mg  half tablet with last LDL under 100- has been a while since  checked Lab Results  Component Value Date   CHOL 160 07/23/2017   HDL 49.10 07/23/2017   LDLCALC 88 07/23/2017   TRIG 111.0 07/23/2017   CHOLHDL 3 07/23/2017   A/P: nonfasting labs- yogurt, apple , coffee show reasonable control of lipids  Meniere disease, bilateral S: bilateral ear pressure and some pressure behind the eyes.   6 weeks ago treated for bacterial sinusitis with 10 days of augmentin- 25% improvement on that. He had been on allegra but stopped. These symptoms dated all the way back to easter when he had R ear fullness- had seen Dr. Constance Holster of ENT. Had epley maneuvers done and helpd some and did some home modified epleys which have helped some. Still getting pressure behind his eyes and around his ears. Has not seen Dr. Constance Holster back yet. Feels somewhat wobbly/vertigo still but vertigo overall much improved. He has had increased ringing in ears in last few months. Most of the vertigo is with movement but occasionally feels it at rest. Particularly looking down or looking up are triggers. Both ears equally affected at present.   Has noted hearing loss bilaterally. Tinnitus has increased. Still wobbly. epley's help some A/P: I am suspicious for meniere's- hearing loss, tinnitus increased, vertigo- improvement with home epley maneuvers. We discussed through treatments including low salt diet in particular. May need to consider diuretic.  See avs "Possible Meniere's - I would really like for you to get Dr. Janeice Robinson opinion when you get back  from your trip. If he does not think sinus/ear related then we may need to consider MRI brain. Try to focus on guidelines below. I could be off base with this but these steps seem reasonable regardless"  Lab/Order associations: Hyperlipidemia, unspecified hyperlipidemia type - Plan: CBC, Comprehensive metabolic panel, Lipid panel, POCT Urinalysis Dipstick (Automated)  Essential hypertension - Plan: CBC, Comprehensive metabolic panel, Lipid panel, POCT  Urinalysis Dipstick (Automated)  Screening for prostate cancer - Plan: PSA  Meniere disease, bilateral  Would be reasonable to go ahead and schedule physical 6-12 months though we are getting physical labs essentially today  Meds ordered this encounter  Medications  . simvastatin (ZOCOR) 80 MG tablet    Sig: TAKE 1/2 TABLET BY MOUTH EVERY NIGHT AT BEDTIME    Dispense:  90 tablet    Refill:  3  . DISCONTD: losartan (COZAAR) 100 MG tablet    Sig: Take 1 tablet (100 mg total) by mouth daily.    Dispense:  30 tablet    Refill:  5  . losartan (COZAAR) 100 MG tablet    Sig: Take 1 tablet (100 mg total) by mouth daily.    Dispense:  90 tablet    Refill:  3    Return precautions advised.  Garret Reddish, MD

## 2017-07-23 NOTE — Patient Instructions (Addendum)
Please stop by lab before you go  Would be reasonable to go ahead and schedule physical 6-12 months though we are getting physical labs essentially today  Possible Meniere's - I would really like for you to get Dr. Janeice Robinson opinion when you get back from your trip. If he does not think sinus/ear related then we may need to consider MRI brain. Try to focus on guidelines below. I could be off base with this but these steps seem reasonable regardless  Meniere Disease Meniere disease is an inner ear disorder. It causes attacks of a spinning sensation (vertigo), dizziness, and ringing in the ear (tinnitus). It also causes hearing loss and a feeling of fullness or pressure in the ear. This is a lifelong condition, and it may get worse over time. You may have drop attacks or severe dizziness that makes you fall. A drop attack is when you suddenly fall without losing consciousness and you quickly recover after a few seconds or minutes. What are the causes? This condition is caused by having too much of the fluid that is in your inner ear (endolymph). When fluid builds up in your inner ear, it affects the nerves that control balance and hearing. The reason for the fluid buildup is not known. Possible causes include:  Allergies.  An abnormal reaction of the body's defense system (autoimmune disease).  Viral infection of the inner ear.  Head injury.  What increases the risk? You are more likely to develop this condition if:  You are older than age 32.  You have a family history of Meniere disease.  You have a history of autoimmune disease.  You have a history of migraine headaches.  What are the signs or symptoms? Symptoms of this condition can come and go and may last for up to 4 hours at a time. Symptoms usually start in one ear. They may become more frequent and eventually involve both ears. Symptoms can include:  Fullness and pressure in your ear.  Roaring or ringing in your  ear.  Vertigo and loss of balance.  Dizziness.  Decreased hearing.  Nausea and vomiting.  How is this diagnosed? This condition is diagnosed based on:  A physical exam.  Tests , such as: ? A hearing test (audiogram). ? An electronystagmogram. This tests your balance nerve (vestibular nerve). ? Imaging studies of your inner ear, such as CT scan or MRI. ? Other balance tests, such as rotational or balance platform tests.  How is this treated? There is no cure for this condition, but treatment can help to manage your symptoms. Treatment may include:  A low-salt diet. Limiting salt may help to reduce fluid in the body and relieve symptoms.  Oral or injected medicines to reduce or control: ? Vertigo. ? Nausea. ? Fluid retention. ? Dizziness  Hearing aids.  Inner ear surgery. This is rare.  When you have symptoms, it can be helpful to lie down on a flat surface and focus your eyes on one object that does not move. Try to stay in that position until your symptoms go away. Follow these instructions at home: Eating and drinking  Eat the same amount of food at the same time every day, including snacks.  Do not skip meals.  Avoid caffeine.  Drink enough fluids to keep your urine clear or pale yellow.  Limit alcoholic drinks to one drink a day for non-pregnant women and 2 drinks a day for men. One drink equals 12 oz of beer, 5 oz of  wine, or 1 oz of hard liquor.  Limit the salt (sodium) in your diet as told by your health care provider. Check ingredients and nutrition facts on packaged foods and beverages.  Do not eat foods that contain monosodium glutamate (MSG). General instructions  Do not use any products that contain nicotine or tobacco, such as cigarettes and e-cigarettes. If you need help quitting, ask your health care provider.  Take over-the-counter and prescription medicines only as told by your health care provider.  Find ways to reduce or avoid stress. If  you need help with this, ask your health care provider.  Do not drive if you have vertigo or dizziness. Contact a health care provider if:  You have symptoms that last longer than 4 hours.  You have new or worse symptoms. Get help right away if:  You have been vomiting for 24 hours.  You cannot keep fluids down.  You have chest pain or trouble breathing. Summary  Meniere disease is an inner ear disorder. It causes attacks of a spinning sensation (vertigo), dizziness, and ringing in the ear (tinnitus). It also causes hearing loss and a feeling of fullness or pressure in the ear.  Symptoms of this condition can come and go and may last for up to 4 hours at a time.  When you have symptoms, it can be helpful to lie down on a flat surface and focus your eyes on one object that does not move. Try to stay in that position until your symptoms go away. This information is not intended to replace advice given to you by your health care provider. Make sure you discuss any questions you have with your health care provider. Document Released: 12/20/1999 Document Revised: 11/13/2015 Document Reviewed: 11/13/2015 Elsevier Interactive Patient Education  2017 [employer].

## 2017-07-24 DIAGNOSIS — H8103 Meniere's disease, bilateral: Secondary | ICD-10-CM | POA: Insufficient documentation

## 2017-07-24 NOTE — Assessment & Plan Note (Signed)
S: bilateral ear pressure and some pressure behind the eyes.   6 weeks ago treated for bacterial sinusitis with 10 days of augmentin- 25% improvement on that. He had been on allegra but stopped. These symptoms dated all the way back to easter when he had R ear fullness- had seen Dr. Constance Holster of ENT. Had epley maneuvers done and helpd some and did some home modified epleys which have helped some. Still getting pressure behind his eyes and around his ears. Has not seen Dr. Constance Holster back yet. Feels somewhat wobbly/vertigo still but vertigo overall much improved. He has had increased ringing in ears in last few months. Most of the vertigo is with movement but occasionally feels it at rest. Particularly looking down or looking up are triggers. Both ears equally affected at present.   Has noted hearing loss bilaterally. Tinnitus has increased. Still wobbly. epley's help some A/P: I am suspicious for meniere's- hearing loss, tinnitus increased, vertigo- improvement with home epley maneuvers. We discussed through treatments including low salt diet in particular. May need to consider diuretic.  See avs "Possible Meniere's - I would really like for you to get Dr. Janeice Robinson opinion when you get back from your trip. If he does not think sinus/ear related then we may need to consider MRI brain. Try to focus on guidelines below. I could be off base with this but these steps seem reasonable regardless"

## 2017-07-24 NOTE — Assessment & Plan Note (Signed)
S: controlled on losartan 100mg  BP Readings from Last 3 Encounters:  07/23/17 138/90  06/08/17 120/76  05/28/16 122/82  A/P: We discussed blood pressure goal of <140/90. Continue current meds

## 2017-07-24 NOTE — Assessment & Plan Note (Signed)
S: well controlled on simvastatin 80mg  half tablet with last LDL under 100- has been a while since checked Lab Results  Component Value Date   CHOL 160 07/23/2017   HDL 49.10 07/23/2017   LDLCALC 88 07/23/2017   TRIG 111.0 07/23/2017   CHOLHDL 3 07/23/2017   A/P: nonfasting labs- yogurt, apple , coffee show reasonable control of lipids

## 2017-10-18 ENCOUNTER — Ambulatory Visit: Payer: 59

## 2017-10-30 DIAGNOSIS — Z23 Encounter for immunization: Secondary | ICD-10-CM | POA: Diagnosis not present

## 2018-02-07 DIAGNOSIS — S92525A Nondisplaced fracture of medial phalanx of left lesser toe(s), initial encounter for closed fracture: Secondary | ICD-10-CM | POA: Diagnosis not present

## 2018-02-14 DIAGNOSIS — S92525D Nondisplaced fracture of medial phalanx of left lesser toe(s), subsequent encounter for fracture with routine healing: Secondary | ICD-10-CM | POA: Diagnosis not present

## 2018-06-23 ENCOUNTER — Encounter: Payer: Self-pay | Admitting: Gastroenterology

## 2018-07-26 ENCOUNTER — Other Ambulatory Visit: Payer: Self-pay | Admitting: Family Medicine

## 2018-07-26 NOTE — Telephone Encounter (Signed)
Last OV 07/23/2017. Will send in 30 day supply. Needs follow-up appointment before next refill. Please contact pt to schedule. Thanks!

## 2018-07-26 NOTE — Telephone Encounter (Signed)
lvm for patient to call back and schedule a f/u appt with Dr. Yong Channel

## 2018-07-28 ENCOUNTER — Other Ambulatory Visit: Payer: Self-pay | Admitting: Family Medicine

## 2018-08-18 ENCOUNTER — Ambulatory Visit (INDEPENDENT_AMBULATORY_CARE_PROVIDER_SITE_OTHER): Payer: 59 | Admitting: Family Medicine

## 2018-08-18 ENCOUNTER — Encounter: Payer: Self-pay | Admitting: Gastroenterology

## 2018-08-18 ENCOUNTER — Encounter: Payer: Self-pay | Admitting: Family Medicine

## 2018-08-18 ENCOUNTER — Other Ambulatory Visit: Payer: Self-pay

## 2018-08-18 VITALS — BP 120/78 | HR 76 | Temp 98.6°F | Ht 67.0 in | Wt 207.0 lb

## 2018-08-18 DIAGNOSIS — Z125 Encounter for screening for malignant neoplasm of prostate: Secondary | ICD-10-CM | POA: Diagnosis not present

## 2018-08-18 DIAGNOSIS — E785 Hyperlipidemia, unspecified: Secondary | ICD-10-CM | POA: Diagnosis not present

## 2018-08-18 DIAGNOSIS — Z23 Encounter for immunization: Secondary | ICD-10-CM

## 2018-08-18 DIAGNOSIS — E669 Obesity, unspecified: Secondary | ICD-10-CM

## 2018-08-18 DIAGNOSIS — Z1211 Encounter for screening for malignant neoplasm of colon: Secondary | ICD-10-CM

## 2018-08-18 DIAGNOSIS — I10 Essential (primary) hypertension: Secondary | ICD-10-CM

## 2018-08-18 DIAGNOSIS — R739 Hyperglycemia, unspecified: Secondary | ICD-10-CM

## 2018-08-18 DIAGNOSIS — Z0000 Encounter for general adult medical examination without abnormal findings: Secondary | ICD-10-CM | POA: Diagnosis not present

## 2018-08-18 LAB — POC URINALSYSI DIPSTICK (AUTOMATED)
Bilirubin, UA: NEGATIVE
Blood, UA: NEGATIVE
Glucose, UA: NEGATIVE
Ketones, UA: NEGATIVE
Leukocytes, UA: NEGATIVE
Nitrite, UA: NEGATIVE
Protein, UA: NEGATIVE
Spec Grav, UA: 1.02 (ref 1.010–1.025)
Urobilinogen, UA: 0.2 E.U./dL
pH, UA: 6 (ref 5.0–8.0)

## 2018-08-18 MED ORDER — SIMVASTATIN 40 MG PO TABS: 40.0000 mg | ORAL_TABLET | Freq: Every day | ORAL | 3 refills | Status: DC

## 2018-08-18 MED ORDER — LOSARTAN POTASSIUM 100 MG PO TABS: 100.0000 mg | ORAL_TABLET | Freq: Every day | ORAL | 3 refills | Status: DC

## 2018-08-18 NOTE — Progress Notes (Signed)
Phone: [phone]   Subjective:  Patient presents today for their annual physical. Chief complaint-noted.   See problem oriented charting- ROS- full  review of systems was completed and negative including No chest pain or shortness of breath. No headache or blurry vision.   The following were reviewed and entered/updated in epic: Past Medical History:  Diagnosis Date  . Allergy   . Hyperlipidemia   . Hypertension    Patient Active Problem List   Diagnosis Date Noted  . Hyperglycemia 02/04/2015    Priority: Medium  . Obstructive sleep apnea 05/28/2012    Priority: Medium  . Essential hypertension 03/25/2007    Priority: Medium  . Hyperlipidemia 07/29/2006    Priority: Medium  . Former smoker 03/06/2014    Priority: Low  . Insomnia 03/06/2014    Priority: Low  . Seasonal and perennial allergic rhinitis 06/21/2009    Priority: Low  . Meniere disease, bilateral 07/24/2017   Past Surgical History:  Procedure Laterality Date  . APPENDECTOMY    . cataract lens implants     bilateral  . SINUS IRRIGATION  2003    Family History  Problem Relation Age of Onset  . Heart attack Father        35s, heavy smoker  . Diabetes Brother        x2  . Nephrolithiasis Daughter   . GER disease Mother   . Hypertension Brother        x2    Medications- reviewed and updated Current Outpatient Medications  Medication Sig Dispense Refill  . losartan (COZAAR) 100 MG tablet TAKE ONE TABLET BY MOUTH DAILY 90 tablet 0  . Multiple Vitamin (MULTIVITAMIN) tablet Take 1 tablet by mouth daily.      . simvastatin (ZOCOR) 80 MG tablet TAKE ONE-HALF TABLET BY MOUTH BY MOUTH AT BEDTIME 15 tablet 0   No current facility-administered medications for this visit.     Allergies-reviewed and updated No Known Allergies  Social History   Social History Narrative   Married (wife patient outside practice) 3 children, 1 step child. 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       Hobbies: formerly played hockey-wants to get back in, golf, plays music   Objective  Objective:  BP 120/78 (BP Location: Left Arm, Patient Position: Sitting, Cuff Size: Normal)   Pulse 76   Temp 98.6 F (37 C) (Oral)   Ht 5\' 7"  (1.702 m)   Wt 207 lb (93.9 kg)   SpO2 96%   BMI 32.42 kg/m  Gen: NAD, resting comfortably HEENT: Mucous membranes are moist. Oropharynx normal Neck: no thyromegaly and no cervical lymphadenopathy CV: RRR no murmurs rubs or gallops Lungs: CTAB no crackles, wheeze, rhonchi Abdomen: soft/nontender/nondistended/normal bowel sounds. No rebound or guarding.  Ext: no edema and 2+ pulses Skin: warm, dry Neuro: grossly normal, moves all extremities, PERRLA    Assessment and Plan  63 y.o. male presenting for annual physical.  Health Maintenance counseling: 1. Anticipatory guidance: Patient counseled regarding regular dental exams -q3 months, eye exams -yearly, now due,  avoiding smoking and second hand smoke- occasional cigar- advised against , limiting alcohol to 2 beverages per day.   2. Risk factor reduction:  Advised patient of need for regular exercise and diet rich and fruits and vegetables to reduce risk of heart attack and stroke. [name] Kitchen See hyperglycemia section- eating less and working tons of hours and then doing his yardwork and staying active at home (no  structured exercise) Wt Readings from Last 3 Encounters:  08/18/18 207 lb (93.9 kg)  07/23/17 221 lb 3.2 oz (100.3 kg)  06/08/17 226 lb 9.6 oz (102.8 kg)  3. Immunizations/screenings/ancillary studies- advised fall flu shot. shingrix today Immunization History  Administered Date(s) Administered  . Influenza Split 11/04/2010, 11/04/2011  . Influenza Whole 12/07/2006, 11/05/2008, 10/17/2009  . Influenza,inj,Quad PF,6+ Mos 11/30/2012, 09/30/2016  . Influenza-Unspecified 12/16/2013, 10/02/2014, 09/12/2015, 10/30/2017  . Td 06/06/2006  . Tdap 06/08/2017  4. Prostate cancer screening- psa up slightly last  year but still consistent with baseline- update psa today. Defer rectal unless psa trend concerning  Lab Results  Component Value Date   PSA 2.09 07/23/2017   PSA 1.53 05/23/2015   PSA 1.81 02/27/2015   5. Colon cancer screening - refer today- last in 2010 6. Skin cancer screening- no dermatologist. advised regular sunscreen use. Denies worrisome, changing, or new skin lesions.  7. Cigar smoker- advised cessation, get UA. 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 Tries to social distance from staff.   #hypertension S: controlled on Losartan 100 mg daily. Not checking BP at home. Reports occasional HA s/p fall 08/01/18 while at the beach, has been icing his head, there is a knot still present. Denies dizziness, CP, SOB, visual changes. Does not add salt to food.  A/P: well controlled - continue current medicine  #hyperlipidemia S:  controlled on Simvastatin 80 mg 1/2 tablet daily. Reports occasional leg cramps, worse at night. Takes OTC med for this- med from [facility].  Lab Results  Component Value Date   CHOL 160 07/23/2017   HDL 49.10 07/23/2017   LDLCALC 88 07/23/2017   TRIG 111.0 07/23/2017   CHOLHDL 3 07/23/2017   A/P: good control on last check- update again today- change to 40mg  tablets  # Hyperglycemia/family history diabetes S: previously controlled with diet and exercise.  CBGs- Not checking at home.  Exercise and diet- Limiting carb and sugar intake. Mostly cooks at home avoiding fast food and processed food. Exercising intermittently, nothing structured, occasionally walks and does yard work.   Lab Results  Component Value Date   HGBA1C 5.4 03/13/2016   HGBA1C 5.5 05/28/2015   HGBA1C 5.9 02/21/2015   A/P: update a1c with labs today- hopefully remains controlled.   Vertigo- possible BPPV vs. Meniere's per Dr. Constance Holster- bothers him more in the spring  # Insomnia- does not tolerate CPAP.  Trying to get 10-15 nap in daytime. With weight loss hoping osa is better.   Recommended follow up: can do 6 month follow up, he prefers 1 year physica   Lab/Order associations: some gravy with chicken rice and green beans and cornbread.   No diagnosis found.  No orders of the defined types were placed in this encounter.   Return precautions advised.  Garret Reddish, MD

## 2018-08-18 NOTE — Patient Instructions (Addendum)
Health Maintenance Due  Topic Date Due  . COLONOSCOPY - We will call you within two weeks about your referral to GI. If you do not hear within 3 weeks, give [place] a call.   06/23/2018  . INFLUENZA VACCINE We should have flu shots available by September. Please strongly consider getting flu shot this year. If you get your flu shot at a pharmacy- please let us know.  08/06/2018   Shingrix #1 today. Repeat injection in 2-5 months. Schedule a nurse visit for the 2nd injection before you leave today (at the check out desk)  Please stop by lab before you go If you do not have mychart- we will call you about results within 5 business days of [place] receiving them.  If you have mychart- we will send your results within 3 business days of [place] receiving them.  If abnormal or we want to clarify a result, we will call or mychart you to make sure you receive the message.  If you have questions or concerns or don't hear within 5-7 days, please send [place] a message or call us.

## 2018-08-18 NOTE — Addendum Note (Signed)
Addended by: Jasper Loser on: 08/18/2018 04:26 PM   Modules accepted: Orders

## 2018-08-18 NOTE — Addendum Note (Signed)
Addended by: Francis Dowse T on: 08/18/2018 04:17 PM   Modules accepted: Orders

## 2018-08-19 LAB — CBC
HCT: 40.8 % (ref 39.0–52.0)
Hemoglobin: 13.8 g/dL (ref 13.0–17.0)
MCHC: 33.7 g/dL (ref 30.0–36.0)
MCV: 95.6 fl (ref 78.0–100.0)
Platelets: 245 10*3/uL (ref 150.0–400.0)
RBC: 4.27 Mil/uL (ref 4.22–5.81)
RDW: 12.9 % (ref 11.5–15.5)
WBC: 9.2 10*3/uL (ref 4.0–10.5)

## 2018-08-19 LAB — COMPREHENSIVE METABOLIC PANEL
ALT: 24 U/L (ref 0–53)
AST: 19 U/L (ref 0–37)
Albumin: 4.6 g/dL (ref 3.5–5.2)
Alkaline Phosphatase: 47 U/L (ref 39–117)
BUN: 13 mg/dL (ref 6–23)
CO2: 28 mEq/L (ref 19–32)
Calcium: 9.4 mg/dL (ref 8.4–10.5)
Chloride: 104 mEq/L (ref 96–112)
Creatinine, Ser: 1.07 mg/dL (ref 0.40–1.50)
GFR: 69.45 mL/min (ref >60.00)
Glucose, Bld: 88 mg/dL (ref 70–99)
Potassium: 3.9 mEq/L (ref 3.5–5.1)
Sodium: 140 mEq/L (ref 135–145)
Total Bilirubin: 0.7 mg/dL (ref 0.2–1.2)
Total Protein: 7 g/dL (ref 6.0–8.3)

## 2018-08-19 LAB — LIPID PANEL
Cholesterol: 142 mg/dL (ref 0–200)
HDL: 56.1 mg/dL (ref >39.00)
LDL Cholesterol: 61 mg/dL (ref 0–99)
NonHDL: 86.2
Total CHOL/HDL Ratio: 3
Triglycerides: 126 mg/dL (ref 0.0–149.0)
VLDL: 25.2 mg/dL (ref 0.0–40.0)

## 2018-08-19 LAB — HEMOGLOBIN A1C: Hgb A1c MFr Bld: 5.7 % (ref 4.6–6.5)

## 2018-08-19 LAB — PSA: PSA: 3.2 ng/mL (ref 0.10–4.00)

## 2018-08-22 ENCOUNTER — Telehealth: Payer: Self-pay

## 2018-08-22 ENCOUNTER — Other Ambulatory Visit: Payer: Self-pay

## 2018-08-22 DIAGNOSIS — R972 Elevated prostate specific antigen [PSA]: Secondary | ICD-10-CM

## 2018-08-22 NOTE — Telephone Encounter (Signed)
Called pt with lab results. He has lab visit scheduled for 09/20/18. He would like to know if order can be placed to have his blood type checked when he comes in.

## 2018-08-22 NOTE — Telephone Encounter (Signed)
Left detailed message informing  of update. 

## 2018-08-22 NOTE — Telephone Encounter (Signed)
Left message to return phone call.

## 2018-08-22 NOTE — Telephone Encounter (Signed)
I am not sure about coverage for blood type-I have never ordered it on an outpatient basis because we typically order a type and screen when we think patient's may need a blood transfusion.  A safe, effective, affordable way to know your blood type is to donate your blood.  [employer] or [facility] [employer] are good options- from my understanding 1 [employer] also does a COVID-19 antibody test for everyone who donates blood

## 2018-08-23 ENCOUNTER — Other Ambulatory Visit: Payer: Self-pay | Admitting: Family Medicine

## 2018-09-08 ENCOUNTER — Other Ambulatory Visit: Payer: Self-pay

## 2018-09-08 ENCOUNTER — Ambulatory Visit (AMBULATORY_SURGERY_CENTER): Payer: Self-pay | Admitting: *Deleted

## 2018-09-08 VITALS — Temp 97.1°F | Ht 67.0 in | Wt 206.5 lb

## 2018-09-08 DIAGNOSIS — Z1211 Encounter for screening for malignant neoplasm of colon: Secondary | ICD-10-CM

## 2018-09-08 MED ORDER — SUPREP BOWEL PREP KIT 17.5-3.13-1.6 GM/177ML PO SOLN: 1.0000 | Freq: Once | ORAL | 0 refills | Status: AC

## 2018-09-08 NOTE — Progress Notes (Signed)
No egg or soy allergy known to patient  No issues with past sedation with any surgeries  or procedures, no intubation problems  No diet pills per patient No home 02 use per patient  No blood thinners per patient  Pt denies issues with constipation  No A fib or A flutter  EMMI video sent to pt's e mail  Suprep $15 coupon to pt

## 2018-09-09 ENCOUNTER — Encounter: Payer: Self-pay | Admitting: Gastroenterology

## 2018-09-20 ENCOUNTER — Other Ambulatory Visit: Payer: 59

## 2018-09-21 ENCOUNTER — Other Ambulatory Visit (INDEPENDENT_AMBULATORY_CARE_PROVIDER_SITE_OTHER): Payer: 59

## 2018-09-21 ENCOUNTER — Telehealth: Payer: Self-pay

## 2018-09-21 ENCOUNTER — Other Ambulatory Visit: Payer: Self-pay

## 2018-09-21 DIAGNOSIS — R972 Elevated prostate specific antigen [PSA]: Secondary | ICD-10-CM | POA: Diagnosis not present

## 2018-09-21 LAB — PSA: PSA: 2.03 ng/mL (ref 0.10–4.00)

## 2018-09-21 NOTE — Telephone Encounter (Signed)
Pt answered "NO" to all questions °

## 2018-09-21 NOTE — Telephone Encounter (Signed)
Covid-19 screening questions   Do you now or have you had a fever in the last 14 days?  Do you have any respiratory symptoms of shortness of breath or cough now or in the last 14 days?  Do you have any family members or close contacts with diagnosed or suspected Covid-19 in the past 14 days?  Have you been tested for Covid-19 and found to be positive?       

## 2018-09-22 ENCOUNTER — Encounter: Payer: Self-pay | Admitting: Gastroenterology

## 2018-09-22 ENCOUNTER — Other Ambulatory Visit: Payer: Self-pay | Admitting: Gastroenterology

## 2018-09-22 ENCOUNTER — Other Ambulatory Visit: Payer: Self-pay

## 2018-09-22 ENCOUNTER — Ambulatory Visit (AMBULATORY_SURGERY_CENTER): Payer: 59 | Admitting: Gastroenterology

## 2018-09-22 VITALS — BP 105/75 | HR 53 | Temp 98.5°F | Resp 13 | Ht 67.0 in | Wt 206.0 lb

## 2018-09-22 DIAGNOSIS — D123 Benign neoplasm of transverse colon: Secondary | ICD-10-CM

## 2018-09-22 DIAGNOSIS — D122 Benign neoplasm of ascending colon: Secondary | ICD-10-CM | POA: Diagnosis not present

## 2018-09-22 DIAGNOSIS — Z1211 Encounter for screening for malignant neoplasm of colon: Secondary | ICD-10-CM

## 2018-09-22 DIAGNOSIS — D120 Benign neoplasm of cecum: Secondary | ICD-10-CM | POA: Diagnosis not present

## 2018-09-22 MED ORDER — SODIUM CHLORIDE 0.9 % IV SOLN: 500.0000 mL | Freq: Once | INTRAVENOUS | Status: DC

## 2018-09-22 NOTE — Progress Notes (Signed)
Called to room to assist during endoscopic procedure.  Patient ID and intended procedure confirmed with present staff. Received instructions for my participation in the procedure from the performing physician.  

## 2018-09-22 NOTE — Patient Instructions (Signed)
Impression/Recommendations:  Polyp handout given to patient. Diverticulosis handout given to patient.  Hemorrhoid handout given to patient. High fiber diet handout given to patient.  Continue present medications. Await pathology results.  Repeat colonoscopy.  Date to be determined after pathology results reviewed.  YOU HAD AN ENDOSCOPIC PROCEDURE TODAY AT [facility]:   Refer to the procedure report that was given to you for any specific questions about what was found during the examination.  If the procedure report does not answer your questions, please call your gastroenterologist to clarify.  If you requested that your care partner not be given the details of your procedure findings, then the procedure report has been included in a sealed envelope for you to review at your convenience later.  YOU SHOULD EXPECT: Some feelings of bloating in the abdomen. Passage of more gas than usual.  Walking can help get rid of the air that was put into your GI tract during the procedure and reduce the bloating. If you had a lower endoscopy (such as a colonoscopy or flexible sigmoidoscopy) you may notice spotting of blood in your stool or on the toilet paper. If you underwent a bowel prep for your procedure, you may not have a normal bowel movement for a few days.  Please Note:  You might notice some irritation and congestion in your nose or some drainage.  This is from the oxygen used during your procedure.  There is no need for concern and it should clear up in a day or so.  SYMPTOMS TO REPORT IMMEDIATELY:   Following lower endoscopy (colonoscopy or flexible sigmoidoscopy):  Excessive amounts of blood in the stool  Significant tenderness or worsening of abdominal pains  Swelling of the abdomen that is new, acute  Fever of 100F or higher For urgent or emergent issues, a gastroenterologist can be reached at any hour by calling [phone].   DIET:  We do recommend a small meal at  first, but then you may proceed to your regular diet.  Drink plenty of fluids but you should avoid alcoholic beverages for 24 hours.  ACTIVITY:  You should plan to take it easy for the rest of today and you should NOT DRIVE or use heavy machinery until tomorrow (because of the sedation medicines used during the test).    FOLLOW UP: Our staff will call the number listed on your records 48-72 hours following your procedure to check on you and address any questions or concerns that you may have regarding the information given to you following your procedure. If we do not reach you, we will leave a message.  We will attempt to reach you two times.  During this call, we will ask if you have developed any symptoms of COVID 19. If you develop any symptoms (ie: fever, flu-like symptoms, shortness of breath, cough etc.) before then, please call [phone].  If you test positive for Covid 19 in the 2 weeks post procedure, please call and report this information to [place].    If any biopsies were taken you will be contacted by phone or by letter within the next 1-3 weeks.  Please call us at [phone] if you have not heard about the biopsies in 3 weeks.    SIGNATURES/CONFIDENTIALITY: You and/or your care partner have signed paperwork which will be entered into your electronic medical record.  These signatures attest to the fact that that the information above on your After Visit Summary has been reviewed and is understood.  Full responsibility of the confidentiality of this discharge information lies with you and/or your care-partner.

## 2018-09-22 NOTE — Progress Notes (Signed)
Pt's states no medical or surgical changes since previsit or office visit.  Temp-[name]  Vital signs-Sollie Vultaggio 

## 2018-09-22 NOTE — Progress Notes (Signed)
Pt Drowsy. VSS. To PACU, report to RN. No anesthetic complications noted.  

## 2018-09-22 NOTE — Op Note (Signed)
[place] Patient Name: Scott Neal Procedure Date: 09/22/2018 8:06 AM MRN: [id number] Endoscopist: Ladene Artist , MD Age: 63 Referring MD:  Date of Birth: 1955/12/14 Gender: Male Account #: [id number] Procedure:                Colonoscopy Indications:              Screening for colorectal malignant neoplasm Medicines:                Monitored Anesthesia Care Procedure:                Pre-Anesthesia Assessment:                           - Prior to the procedure, a History and Physical                            was performed, and patient medications and                            allergies were reviewed. The patient's tolerance of                            previous anesthesia was also reviewed. The risks                            and benefits of the procedure and the sedation                            options and risks were discussed with the patient.                            All questions were answered, and informed consent                            was obtained. Prior Anticoagulants: The patient has                            taken no previous anticoagulant or antiplatelet                            agents. ASA Grade Assessment: II - A patient with                            mild systemic disease. After reviewing the risks                            and benefits, the patient was deemed in                            satisfactory condition to undergo the procedure.                           After obtaining informed consent, the colonoscope  was passed under direct vision. Throughout the                            procedure, the patient's blood pressure, pulse, and                            oxygen saturations were monitored continuously. The                            Colonoscope was introduced through the anus and                            advanced to the the cecum, identified by                            appendiceal orifice and  ileocecal valve. The                            ileocecal valve, appendiceal orifice, and rectum                            were photographed. The quality of the bowel                            preparation was adequate. The colonoscopy was                            performed without difficulty. The patient tolerated                            the procedure well. Scope In: 8:11:26 AM Scope Out: 8:28:53 AM Scope Withdrawal Time: 0 hours 15 minutes 15 seconds  Total Procedure Duration: 0 hours 17 minutes 27 seconds  Findings:                 The perianal and digital rectal examinations were                            normal.                           A 4 mm polyp was found in the cecum. The polyp was                            sessile. The polyp was removed with a cold biopsy                            forceps. Resection and retrieval were complete.                           Two sessile polyps were found in the transverse                            colon and ascending colon. The polyps were 6 mm in  size. These polyps were removed with a cold snare.                            Resection and retrieval were complete.                           A few small-mouthed diverticula were found in the                            right colon. There was no evidence of diverticular                            bleeding.                           Multiple medium-mouthed diverticula were found in                            the left colon. There was narrowing of the colon in                            association with the diverticular opening. There                            was evidence of diverticular spasm. There was                            evidence of an impacted diverticulum. There was no                            evidence of diverticular bleeding.                           Internal hemorrhoids were found during                            retroflexion. The hemorrhoids were  small and Grade                            I (internal hemorrhoids that do not prolapse).                           The exam was otherwise without abnormality on                            direct and retroflexion views. Complications:            No immediate complications. Estimated blood loss:                            None. Estimated Blood Loss:     Estimated blood loss: none. Impression:               - One 4 mm polyp in the cecum, removed with a cold  biopsy forceps. Resected and retrieved.                           - Two 6 mm polyps in the transverse colon and in                            the ascending colon, removed with a cold snare.                            Resected and retrieved.                           - Mild diverticulosis in the right colon.                           - Moderate diverticulosis in the left colon.                           - Internal hemorrhoids.                           - The examination was otherwise normal on direct                            and retroflexion views. Recommendation:           - Repeat colonoscopy after studies are complete for                            surveillance based on pathology results.                           - Patient has a contact number available for                            emergencies. The signs and symptoms of potential                            delayed complications were discussed with the                            patient. Return to normal activities tomorrow.                            Written discharge instructions were provided to the                            patient.                           - High fiber diet.                           - Continue present medications.                           -  Await pathology results. Ladene Artist, MD 09/22/2018 8:34:06 AM This report has been signed electronically.

## 2018-09-26 ENCOUNTER — Telehealth: Payer: Self-pay

## 2018-09-26 NOTE — Telephone Encounter (Signed)
  Follow up Call-  Call back number 09/22/2018  Post procedure Call Back phone  # [phone]  Permission to leave phone message Yes  Some recent data might be hidden     Patient questions:  Do you have a fever, pain , or abdominal swelling? No. Pain Score  0 *  Have you tolerated food without any problems? Yes.    Have you been able to return to your normal activities? Yes.    Do you have any questions about your discharge instructions: Diet   No. Medications  No. Follow up visit  No.  Do you have questions or concerns about your Care? No.  Actions: * If pain score is 4 or above: 1. No action needed, pain <4.Have you developed a fever since your procedure? no  2.   Have you had an respiratory symptoms (SOB or cough) since your procedure? no  3.   Have you tested positive for COVID 19 since your procedure no  4.   Have you had any family members/close contacts diagnosed with the COVID 19 since your procedure?  no   If yes to any of these questions please route to Joylene John, RN and Alphonsa Gin, Therapist, sports.

## 2018-09-29 ENCOUNTER — Encounter: Payer: Self-pay | Admitting: Gastroenterology

## 2018-10-08 ENCOUNTER — Other Ambulatory Visit: Payer: Self-pay

## 2018-10-08 ENCOUNTER — Ambulatory Visit (INDEPENDENT_AMBULATORY_CARE_PROVIDER_SITE_OTHER): Payer: 59

## 2018-10-08 DIAGNOSIS — Z23 Encounter for immunization: Secondary | ICD-10-CM

## 2018-11-15 ENCOUNTER — Ambulatory Visit (INDEPENDENT_AMBULATORY_CARE_PROVIDER_SITE_OTHER): Payer: BC Managed Care – PPO

## 2018-11-15 ENCOUNTER — Other Ambulatory Visit: Payer: Self-pay

## 2018-11-15 DIAGNOSIS — Z23 Encounter for immunization: Secondary | ICD-10-CM | POA: Diagnosis not present

## 2019-02-02 ENCOUNTER — Ambulatory Visit: Payer: Self-pay | Attending: Internal Medicine

## 2019-02-02 DIAGNOSIS — Z20822 Contact with and (suspected) exposure to covid-19: Secondary | ICD-10-CM

## 2019-02-03 ENCOUNTER — Ambulatory Visit: Payer: Medicare PPO | Attending: Internal Medicine

## 2019-02-03 LAB — NOVEL CORONAVIRUS, NAA: SARS-CoV-2, NAA: NOT DETECTED

## 2019-02-15 ENCOUNTER — Other Ambulatory Visit: Payer: Self-pay

## 2019-02-16 ENCOUNTER — Ambulatory Visit (INDEPENDENT_AMBULATORY_CARE_PROVIDER_SITE_OTHER): Payer: Medicare PPO | Admitting: Family Medicine

## 2019-02-16 ENCOUNTER — Encounter: Payer: Self-pay | Admitting: Family Medicine

## 2019-02-16 VITALS — BP 120/68 | HR 73 | Temp 97.9°F | Ht 67.0 in | Wt 209.6 lb

## 2019-02-16 DIAGNOSIS — E785 Hyperlipidemia, unspecified: Secondary | ICD-10-CM | POA: Diagnosis not present

## 2019-02-16 DIAGNOSIS — E669 Obesity, unspecified: Secondary | ICD-10-CM

## 2019-02-16 DIAGNOSIS — I10 Essential (primary) hypertension: Secondary | ICD-10-CM | POA: Diagnosis not present

## 2019-02-16 DIAGNOSIS — G4733 Obstructive sleep apnea (adult) (pediatric): Secondary | ICD-10-CM

## 2019-02-16 DIAGNOSIS — R739 Hyperglycemia, unspecified: Secondary | ICD-10-CM | POA: Diagnosis not present

## 2019-02-16 LAB — POCT GLYCOSYLATED HEMOGLOBIN (HGB A1C): Hemoglobin A1C: 4.9 % (ref 4.0–5.6)

## 2019-02-16 MED ORDER — SIMVASTATIN 40 MG PO TABS: 40.0000 mg | ORAL_TABLET | Freq: Every day | ORAL | 3 refills | Status: DC

## 2019-02-16 MED ORDER — LOSARTAN POTASSIUM 100 MG PO TABS: 100.0000 mg | ORAL_TABLET | Freq: Every day | ORAL | 3 refills | Status: DC

## 2019-02-16 NOTE — Patient Instructions (Addendum)
Health Maintenance Due  Topic Date Due  . PNA vac Low Risk Adult-would recommend Prevnar 13 due to smoking history can not get today due to Covid immunization will get at next visit  01/10/2019   Diabetes risk is lower- continue to work on healthy eating/regular exercise  Other issues are stable- continue current medicines  See you for welcome to medicare visit in september

## 2019-02-16 NOTE — Progress Notes (Signed)
Phone [phone] In person visit   Subjective:   Scott Neal is a 64 y.o. year old very pleasant male patient who presents for/with See problem oriented charting Chief Complaint  Patient presents with  . Hyperlipidemia  . Hypertension   This visit occurred during the SARS-CoV-2 public health emergency.  Safety protocols were in place, including screening questions prior to the visit, additional usage of staff PPE, and extensive cleaning of exam room while observing appropriate contact time as indicated for disinfecting solutions.   Past Medical History-  Patient Active Problem List   Diagnosis Date Noted  . Hyperglycemia 02/04/2015    Priority: Medium  . Essential hypertension 03/25/2007    Priority: Medium  . Hyperlipidemia 07/29/2006    Priority: Medium  . Former smoker 03/06/2014    Priority: Low  . Insomnia 03/06/2014    Priority: Low  . Obstructive sleep apnea 05/28/2012    Priority: Low  . Seasonal and perennial allergic rhinitis 06/21/2009    Priority: Low  . Meniere disease, bilateral 07/24/2017    Medications- reviewed and updated Current Outpatient Medications  Medication Sig Dispense Refill  . APPLE CIDER VINEGAR PO Take by mouth daily.    . cholecalciferol (VITAMIN D3) 25 MCG (1000 UT) tablet Take 1,000 Units by mouth daily.    [name] Kitchen CINNAMON PO Take by mouth.    [name] Kitchen glucosamine-chondroitin 500-400 MG tablet Take 1 tablet by mouth daily.    [name] Kitchen losartan (COZAAR) 100 MG tablet Take 1 tablet (100 mg total) by mouth daily. 90 tablet 3  . Multiple Vitamin (MULTIVITAMIN) tablet Take 1 tablet by mouth daily.      . Multiple Vitamins-Minerals (ZINC PO) Take by mouth daily. Magnesium and calcium  And zinc    . OVER THE COUNTER MEDICATION Turmeric daily  '    . OVER THE COUNTER MEDICATION Prostate health daily    . OVER THE COUNTER MEDICATION Brain Vitamin daily    . OVER THE COUNTER MEDICATION CBD gel tab PRN    . OVER THE COUNTER MEDICATION [employer] Immune supplement     . QUERCETIN PO Take by mouth daily.    [name] Kitchen RESVERATROL PO Take by mouth.    . SELENIUM PO Take by mouth.    . simvastatin (ZOCOR) 40 MG tablet Take 1 tablet (40 mg total) by mouth at bedtime. 90 tablet 3   No current facility-administered medications for this visit.     Objective:  BP 120/68   Pulse 73   Temp 97.9 F (36.6 C) (Temporal)   Ht 5\' 7"  (1.702 m)   Wt 209 lb 9.6 oz (95.1 kg)   SpO2 96%   BMI 32.83 kg/m  Gen: NAD, resting comfortably CV: RRR no murmurs rubs or gallops Lungs: CTAB no crackles, wheeze, rhonchi Ext: no edema Skin: warm, dry    Assessment and Plan   # Obesity S: Weight was 226 June 2019 so he has made good progress. Some setback with holidays and birthday.  205 on home scales up from low of 200- holidays as set back. 14 hours before meals and trying to eat a healthy meal when does eat  Wt Readings from Last 3 Encounters:  02/16/19 209 lb 9.6 oz (95.1 kg)  09/22/18 206 lb (93.4 kg)  09/08/18 206 lb 8 oz (93.7 kg)  A/P: good control -Encouraged need for healthy eating, regular exercise, weight loss.   #Health maintenance-thanked patient for completing his colonoscopy and for receiving his flu shot. Received first Covid  vaccination on 02/10/2019 moderna  #hypertension S: compliant with losartan 100 mg daily.  Does not check blood pressure regularly at home. Denies any SOB, Chest pain or palpitations. Does not add salt to food.  A/P: Stable. Continue current medications.    #hyperlipidemia S: compliant with simvastatin 40 mg. Taking nightly has not had any issues with symptoms. He does golf frequently and works around the house as much as he can.  Lab Results  Component Value Date   CHOL 142 08/18/2018   HDL 56.10 08/18/2018   LDLCALC 61 08/18/2018   TRIG 126.0 08/18/2018   CHOLHDL 3 08/18/2018  A/P:  Stable. Continue current medications.  Last ldl looked great at 67   # Hyperglycemia/insulin resistance/prediabetes-family history of diabetes S:  Previously controlled with diet and exercise.  Does not check blood sugars.  Noted to have elevated A1c August 2020 at 5.7. A/P:  Improved today with a1c of 4.9 on POC machine- even if we adjust up that would be 5.4- we will recheck at [facility]  #Obstructive sleep apnea-unfortunately unable to tolerate CPAP. Does not feel like his symptoms are bad to the point where he needs treatment. Feels like he is getting a good amount of sleep. He on average is getting 7-8 hours of interrupted sleep each night. Only waking up once but is able to go back to sleep fairy easy.  -advised work on weight loss since doesn't tolerate cpap  % Vertigo-BPPV versus Mnire's per Dr. Elspeth Cho in the spring- has been ok lately    #Increased PSA last visit-fortunately trended back down on repeat within 2 months. Will repeat at [facility] or [facility]  Recommended follow up:  Scheduled for [facility] Future Appointments  Date Time Provider [place]  09/13/2019 10:40 AM Marin Olp, MD [facility]   Lab/Order associations:   ICD-10-CM   1. Essential hypertension  I10   2. Hyperlipidemia, unspecified hyperlipidemia type  E78.5   3. Hyperglycemia  R73.9 POCT HgB A1C  4. Obstructive sleep apnea  G47.33     Meds ordered this encounter  Medications  . losartan (COZAAR) 100 MG tablet    Sig: Take 1 tablet (100 mg total) by mouth daily.    Dispense:  90 tablet    Refill:  3  . simvastatin (ZOCOR) 40 MG tablet    Sig: Take 1 tablet (40 mg total) by mouth at bedtime.    Dispense:  90 tablet    Refill:  3   Return precautions advised.  Garret Reddish, MD

## 2019-09-07 NOTE — Progress Notes (Deleted)
Phone: [phone]   Subjective:  Patient presents today for their Welcome to Medicare Exam    Preventive Screening-Counseling & Management  Vision screen: *** No exam data present  Advanced directives: ***  Modifiable Risk Factors/behavioral risk assessment/psychosocial risk assessment Regular exercise: *** Diet: ***  Wt Readings from Last 3 Encounters:  02/16/19 209 lb 9.6 oz (95.1 kg)  09/22/18 206 lb (93.4 kg)  09/08/18 206 lb 8 oz (93.7 kg)   Smoking Status: ***Never Smoker Second Hand Smoking status: ***No smokers in home Alcohol intake: *** per week  Cardiac risk factors:  advanced age (older than 57 for men, 42 for women) *** *** Hyperlipidemia *** *** Hypertension *** No diabetes. *** Lab Results  Component Value Date   HGBA1C 4.9 02/16/2019   Family History: ***  Family History  Problem Relation Age of Onset  . Heart attack Father        78s, heavy smoker  . Diabetes Brother        x2  . GER disease Mother   . Breast cancer Mother   . Hypertension Brother        x2  . Nephrolithiasis Daughter   . Colon polyps Neg Hx   . Colon cancer Neg Hx   . Esophageal cancer Neg Hx   . Rectal cancer Neg Hx   . Stomach cancer Neg Hx     Depression Screen/risk evaluation Risk factors: ***.. PHQ2 0 *** Depression screen [facility] 2/9 02/16/2019 08/18/2018 07/23/2017 03/13/2016 05/28/2015  Decreased Interest 0 0 0 0 0  Down, Depressed, Hopeless 0 0 0 0 0  PHQ - 2 Score 0 0 0 0 0  Altered sleeping 0 - - - -  Tired, decreased energy 0 - - - -  Change in appetite 0 - - - -  Feeling bad or failure about yourself  0 - - - -  Trouble concentrating 0 - - - -  Moving slowly or fidgety/restless 0 - - - -  Suicidal thoughts 0 - - - -  PHQ-9 Score 0 - - - -  Difficult doing work/chores Not difficult at all - - - -    Functional ability and level of safety Mobility assessment: *** timed get up and go <12 seconds Activities of Daily Living- Independent in ADLs (toileting,  bathing, dressing, transferring, eating) and in IADLs (shopping, housekeeping, managing own medications, and handling finances)*** Home Safety: Loose rugs (***), smoke detectors (***), small pets (***), grab bars (***), stairs (***), life-alert system (***) Hearing Difficulties: ***-patient declines Fall Risk: ***None  Fall Risk  02/16/2019 05/28/2015  Falls in the past year? 1 No  Number falls in past yr: 0 -  Injury with Fall? 1 -  Risk for fall due to : Other (Comment) -  Follow up Education provided -   Opioid use history: *** no long term opioids use Self assessment of health status: ***  Required Immunizations needed today:  *** Immunization History  Administered Date(s) Administered  . Influenza Split 11/04/2010, 11/04/2011  . Influenza Whole 12/07/2006, 11/05/2008, 10/17/2009  . Influenza,inj,Quad PF,6+ Mos 11/30/2012, 09/30/2016, 10/30/2017, 10/08/2018  . Influenza-Unspecified 12/16/2013, 10/02/2014, 09/12/2015, 10/30/2017  . Moderna SARS-COVID-2 Vaccination 02/10/2019  . Td 06/06/2006  . Tdap 06/08/2017  . Zoster Recombinat (Shingrix) 08/18/2018, 11/15/2018   Health Maintenance  Topic Date Due  . Pneumonia vaccines (1 of 2 - PCV13) Never done  . COVID-19 Vaccine (2 - Moderna 2-dose series) 03/10/2019  . Flu Shot  08/06/2019  . Colon  Cancer Screening  09/21/2025  . Tetanus Vaccine  06/09/2027  .  Hepatitis C: One time screening is recommended by Center for Disease Control  (CDC) for  adults born from [birth year] through 1966.   Completed  . HIV Screening  Completed    Screening tests-  Health Maintenance Due  Topic Date Due  . PNA vac Low Risk Adult (1 of 2 - PCV13) Never done  . COVID-19 Vaccine (2 - Moderna 2-dose series) 03/10/2019  . INFLUENZA VACCINE  08/06/2019   1. Colon cancer screening- *** 2. Lung Cancer screening- *** 3. Skin cancer screening- *** 4. Prostate cancer screening Lab Results  Component Value Date   PSA 2.03 09/21/2018   PSA 3.20 08/18/2018    PSA 2.09 07/23/2017    4. Cervical cancer screening- *** 5. Breast cancer screening- ***  The following were reviewed and entered/updated in epic: Past Medical History:  Diagnosis Date  . Allergy   . Cataract    removed both eyes  . Hyperlipidemia    currently under ontrol   . Hypertension   . Sleep apnea    no cpap    Patient Active Problem List   Diagnosis Date Noted  . Meniere disease, bilateral 07/24/2017  . Hyperglycemia 02/04/2015  . Former smoker 03/06/2014  . Insomnia 03/06/2014  . Obstructive sleep apnea 05/28/2012  . Seasonal and perennial allergic rhinitis 06/21/2009  . Essential hypertension 03/25/2007  . Hyperlipidemia 07/29/2006   Past Surgical History:  Procedure Laterality Date  . APPENDECTOMY    . cataract lens implants     bilateral  . COLONOSCOPY     x2 last 2010 1 HPP   . DENTAL SURGERY    . SINUS IRRIGATION  2003    Family History  Problem Relation Age of Onset  . Heart attack Father        66s, heavy smoker  . Diabetes Brother        x2  . GER disease Mother   . Breast cancer Mother   . Hypertension Brother        x2  . Nephrolithiasis Daughter   . Colon polyps Neg Hx   . Colon cancer Neg Hx   . Esophageal cancer Neg Hx   . Rectal cancer Neg Hx   . Stomach cancer Neg Hx     Medications- reviewed and updated Current Outpatient Medications  Medication Sig Dispense Refill  . APPLE CIDER VINEGAR PO Take by mouth daily.    . cholecalciferol (VITAMIN D3) 25 MCG (1000 UT) tablet Take 1,000 Units by mouth daily.    [name] Kitchen CINNAMON PO Take by mouth.    [name] Kitchen glucosamine-chondroitin 500-400 MG tablet Take 1 tablet by mouth daily.    [name] Kitchen losartan (COZAAR) 100 MG tablet Take 1 tablet (100 mg total) by mouth daily. 90 tablet 3  . Multiple Vitamin (MULTIVITAMIN) tablet Take 1 tablet by mouth daily.      . Multiple Vitamins-Minerals (ZINC PO) Take by mouth daily. Magnesium and calcium  And zinc    . OVER THE COUNTER MEDICATION Turmeric daily  '    .  OVER THE COUNTER MEDICATION Prostate health daily    . OVER THE COUNTER MEDICATION Brain Vitamin daily    . OVER THE COUNTER MEDICATION CBD gel tab PRN    . OVER THE COUNTER MEDICATION [employer] Immune supplement    . QUERCETIN PO Take by mouth daily.    [name] Kitchen RESVERATROL PO Take by mouth.    . SELENIUM  PO Take by mouth.    . simvastatin (ZOCOR) 40 MG tablet Take 1 tablet (40 mg total) by mouth at bedtime. 90 tablet 3   No current facility-administered medications for this visit.    Allergies-reviewed and updated No Known Allergies  Social History   Socioeconomic History  . Marital status: Married    Spouse name: Not on file  . Number of children: Not on file  . Years of education: Not on file  . Highest education level: Not on file  Occupational History  . Not on file  Tobacco Use  . Smoking status: Current Some Day Smoker    Packs/day: 0.75    Years: 4.00    Pack years: 3.00    Types: Cigarettes, Cigars    Last attempt to quit: 03/11/1997    Years since quitting: 22.5  . Smokeless tobacco: Never Used  Substance and Sexual Activity  . Alcohol use: Yes    Alcohol/week: 10.0 standard drinks    Types: 10 Standard drinks or equivalent per week  . Drug use: No  . Sexual activity: Not on file  Other Topics Concern  . Not on file  Social History Narrative   Married (wife patient outside practice) 3 children, 1 step child. 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       Hobbies: formerly played hockey-wants to get back in, golf, plays music   Social Determinants of Health   Financial Resource Strain:   . Difficulty of Paying Living Expenses: Not on file  Food Insecurity:   . Worried About Charity fundraiser in the Last Year: Not on file  . Ran Out of Food in the Last Year: Not on file  Transportation Needs:   . Lack of Transportation (Medical): Not on file  . Lack of Transportation (Non-Medical): Not on file  Physical Activity:   . Days of Exercise per Week:  Not on file  . Minutes of Exercise per Session: Not on file  Stress:   . Feeling of Stress : Not on file  Social Connections:   . Frequency of Communication with Friends and Family: Not on file  . Frequency of Social Gatherings with Friends and Family: Not on file  . Attends Religious Services: Not on file  . Active Member of Clubs or Organizations: Not on file  . Attends Archivist Meetings: Not on file  . Marital Status: Not on file   Objective  Objective:  There were no vitals taken for this visit. Gen: NAD, resting comfortably HEENT: Mucous membranes are moist. Oropharynx normal Neck: no thyromegaly CV: RRR no murmurs rubs or gallops Lungs: CTAB no crackles, wheeze, rhonchi Abdomen: soft/nontender/nondistended/normal bowel sounds. No rebound or guarding.  Ext: no edema Skin: warm, dry Neuro: grossly normal, moves all extremities, PERRLA   Assessment and Plan:   Welcome to Medicare exam completed-  1. Educated, counseled and referred based on above elements 2. Educated, counseled and referred as appropriate for preventative needs 3. Discussed and documented a written plan for preventiative services and screenings with personalized health advice- After Visit Summary was given to patient which included this plan *** 4. EKG offered [id number]- patient ***  Status of chronic or acute concerns  *** #hypertension S: compliant with ***losartan 100 mg daily.  Does not check blood pressure regularly at home A/P: ***   #hyperlipidemia S: compliant with ***simvastatin 40 mg. A/P: ***   # Hyperglycemia/insulin resistance/prediabetes-family history of diabetes S:  Previously controlled with diet and exercise.  Does not check blood sugars.  Noted to have elevated A1c August 2020 at 5.7. A/P: ***  #Obstructive sleep apnea-unfortunately unable to tolerate CPAP***   % Vertigo-BPPV versus Mnire's per Dr. Elspeth Cho in the spring***   No problem-specific Assessment &  Plan notes found for this encounter.   Recommended follow up: *** Future Appointments  Date Time Provider [place]  09/13/2019 10:40 AM Marin Olp, MD [facility]     Lab/Order associations:   ICD-10-CM   1. Essential hypertension  I10   2. Hyperglycemia  R73.9   3. Hyperlipidemia, unspecified hyperlipidemia type  E78.5     No orders of the defined types were placed in this encounter.   Return precautions advised. Francella Solian, CMA

## 2019-09-13 ENCOUNTER — Encounter: Payer: Self-pay | Admitting: Family Medicine

## 2019-09-13 ENCOUNTER — Ambulatory Visit: Payer: Medicare PPO | Admitting: Family Medicine

## 2019-09-13 ENCOUNTER — Other Ambulatory Visit: Payer: Self-pay

## 2019-09-13 ENCOUNTER — Telehealth: Payer: Medicare PPO | Admitting: Family Medicine

## 2019-09-13 VITALS — Ht 67.0 in | Wt 209.0 lb

## 2019-09-13 DIAGNOSIS — Z9189 Other specified personal risk factors, not elsewhere classified: Secondary | ICD-10-CM

## 2019-09-13 DIAGNOSIS — Z20828 Contact with and (suspected) exposure to other viral communicable diseases: Secondary | ICD-10-CM | POA: Diagnosis not present

## 2019-09-13 NOTE — Progress Notes (Signed)
LOS no charge-patient was doing video visit as he believed we had testing in our building-since we do not want her having to refer him to other facilities we did not charge for this visit S: Son had exposure  To covid 19 was in [place] Sunday-Tuesday with friends and they were found to be positive after having symptoms on Tuesday- august 31st.   He was with his son Friday sept 3rd for 2 hours close proximity. His son has not symptoms. Son has not been able to get tested. Last time he was around son was Friday so it has been 5 days  Patient denies any symptoms today.  Denies any fever, chills, lost of taste or smell or any other symptoms.   A/P:  Possible Covid contact-patient was given information on potential testing sites-generally easier to sign up online and pointing him towards 2 different sites- one for pay and one for no cost testing

## 2019-09-13 NOTE — Patient Instructions (Signed)
Health Maintenance Due  Topic Date Due  . PNA vac Low Risk Adult (1 of 2 - PCV13) will get if needed next office visit  Never done      Recommended follow up: No follow-ups on file.

## 2019-09-27 ENCOUNTER — Ambulatory Visit: Payer: Medicare PPO | Admitting: Family Medicine

## 2019-09-27 NOTE — Progress Notes (Deleted)
Phone: [phone]   Subjective:  Patient presents today for their Welcome to Medicare Exam    Preventive Screening-Counseling & Management  Vision screen: *** No exam data present  Advanced directives: ***  Modifiable Risk Factors/behavioral risk assessment/psychosocial risk assessment Regular exercise: *** Diet: ***  Wt Readings from Last 3 Encounters:  09/13/19 209 lb (94.8 kg)  02/16/19 209 lb 9.6 oz (95.1 kg)  09/22/18 206 lb (93.4 kg)   Smoking Status: ***Never Smoker Second Hand Smoking status: ***No smokers in home Alcohol intake: *** per week  Cardiac risk factors:  advanced age (older than 33 for men, 21 for women) *** *** Hyperlipidemia *** *** Hypertension *** No diabetes. *** Lab Results  Component Value Date   HGBA1C 4.9 02/16/2019   Family History: ***  Family History  Problem Relation Age of Onset  . Heart attack Father        13s, heavy smoker  . Diabetes Brother        x2  . GER disease Mother   . Breast cancer Mother   . Hypertension Brother        x2  . Nephrolithiasis Daughter   . Colon polyps Neg Hx   . Colon cancer Neg Hx   . Esophageal cancer Neg Hx   . Rectal cancer Neg Hx   . Stomach cancer Neg Hx     Depression Screen/risk evaluation Risk factors: ***.. PHQ2 0 *** Depression screen [facility] 2/9 02/16/2019 08/18/2018 07/23/2017 03/13/2016 05/28/2015  Decreased Interest 0 0 0 0 0  Down, Depressed, Hopeless 0 0 0 0 0  PHQ - 2 Score 0 0 0 0 0  Altered sleeping 0 - - - -  Tired, decreased energy 0 - - - -  Change in appetite 0 - - - -  Feeling bad or failure about yourself  0 - - - -  Trouble concentrating 0 - - - -  Moving slowly or fidgety/restless 0 - - - -  Suicidal thoughts 0 - - - -  PHQ-9 Score 0 - - - -  Difficult doing work/chores Not difficult at all - - - -    Functional ability and level of safety Mobility assessment: *** timed get up and go <12 seconds Activities of Daily Living- Independent in ADLs (toileting, bathing,  dressing, transferring, eating) and in IADLs (shopping, housekeeping, managing own medications, and handling finances)*** Home Safety: Loose rugs (***), smoke detectors (***), small pets (***), grab bars (***), stairs (***), life-alert system (***) Hearing Difficulties: ***-patient declines Fall Risk: ***None  Fall Risk  02/16/2019 05/28/2015  Falls in the past year? 1 No  Number falls in past yr: 0 -  Injury with Fall? 1 -  Risk for fall due to : Other (Comment) -  Follow up Education provided -   Opioid use history: *** no long term opioids use Self assessment of health status: ***  Required Immunizations needed today:  *** Immunization History  Administered Date(s) Administered  . Influenza Split 11/04/2010, 11/04/2011  . Influenza Whole 12/07/2006, 11/05/2008, 10/17/2009  . Influenza,inj,Quad PF,6+ Mos 11/30/2012, 09/30/2016, 10/30/2017, 10/08/2018  . Influenza-Unspecified 12/16/2013, 10/02/2014, 09/12/2015, 10/30/2017  . Moderna SARS-COVID-2 Vaccination 02/10/2019, 03/11/2019  . Td 06/06/2006  . Tdap 06/08/2017  . Zoster Recombinat (Shingrix) 08/18/2018, 11/15/2018   Health Maintenance  Topic Date Due  . Pneumonia vaccines (1 of 2 - PCV13) Never done  . Flu Shot  04/04/2020*  . Colon Cancer Screening  09/21/2025  . Tetanus Vaccine  06/09/2027  .  COVID-19 Vaccine  Completed  .  Hepatitis C: One time screening is recommended by Center for Disease Control  (CDC) for  adults born from 50 through 1965.   Completed  . HIV Screening  Completed  *Topic was postponed. The date shown is not the original due date.    Screening tests-  Health Maintenance Due  Topic Date Due  . PNA vac Low Risk Adult (1 of 2 - PCV13) Never done   1. Colon cancer screening- *** 2. Lung Cancer screening- *** 3. Skin cancer screening- *** 4. Prostate cancer screening Lab Results  Component Value Date   PSA 2.03 09/21/2018   PSA 3.20 08/18/2018   PSA 2.09 07/23/2017    4. Cervical cancer  screening- *** 5. Breast cancer screening- ***  The following were reviewed and entered/updated in epic: Past Medical History:  Diagnosis Date  . Allergy   . Cataract    removed both eyes  . Hyperlipidemia    currently under ontrol   . Hypertension   . Sleep apnea    no cpap    Patient Active Problem List   Diagnosis Date Noted  . Meniere disease, bilateral 07/24/2017  . Hyperglycemia 02/04/2015  . Former smoker 03/06/2014  . Insomnia 03/06/2014  . Obstructive sleep apnea 05/28/2012  . Seasonal and perennial allergic rhinitis 06/21/2009  . Essential hypertension 03/25/2007  . Hyperlipidemia 07/29/2006   Past Surgical History:  Procedure Laterality Date  . APPENDECTOMY    . cataract lens implants     bilateral  . COLONOSCOPY     x2 last 2010 1 HPP   . DENTAL SURGERY    . SINUS IRRIGATION  2003    Family History  Problem Relation Age of Onset  . Heart attack Father        62s, heavy smoker  . Diabetes Brother        x2  . GER disease Mother   . Breast cancer Mother   . Hypertension Brother        x2  . Nephrolithiasis Daughter   . Colon polyps Neg Hx   . Colon cancer Neg Hx   . Esophageal cancer Neg Hx   . Rectal cancer Neg Hx   . Stomach cancer Neg Hx     Medications- reviewed and updated Current Outpatient Medications  Medication Sig Dispense Refill  . APPLE CIDER VINEGAR PO Take by mouth daily. (Patient not taking: Reported on 09/13/2019)    . cholecalciferol (VITAMIN D3) 25 MCG (1000 UT) tablet Take 1,000 Units by mouth daily. (Patient not taking: Reported on 09/13/2019)    . CINNAMON PO Take by mouth. (Patient not taking: Reported on 09/13/2019)    . glucosamine-chondroitin 500-400 MG tablet Take 1 tablet by mouth daily. (Patient not taking: Reported on 09/13/2019)    . losartan (COZAAR) 100 MG tablet Take 1 tablet (100 mg total) by mouth daily. 90 tablet 3  . Multiple Vitamin (MULTIVITAMIN) tablet Take 1 tablet by mouth daily.   (Patient not taking: Reported  on 09/13/2019)    . Multiple Vitamins-Minerals (ZINC PO) Take by mouth daily. Magnesium and calcium  And zinc (Patient not taking: Reported on 09/13/2019)    . OVER THE COUNTER MEDICATION Turmeric daily  '    . OVER THE COUNTER MEDICATION Prostate health daily (Patient not taking: Reported on 09/13/2019)    . OVER THE COUNTER MEDICATION Brain Vitamin daily (Patient not taking: Reported on 09/13/2019)    . OVER THE COUNTER MEDICATION  CBD gel tab PRN (Patient not taking: Reported on 09/13/2019)    . OVER THE COUNTER MEDICATION [employer] Immune supplement (Patient not taking: Reported on 09/13/2019)    . QUERCETIN PO Take by mouth daily. (Patient not taking: Reported on 09/13/2019)    . RESVERATROL PO Take by mouth. (Patient not taking: Reported on 09/13/2019)    . SELENIUM PO Take by mouth. (Patient not taking: Reported on 09/13/2019)    . simvastatin (ZOCOR) 40 MG tablet Take 1 tablet (40 mg total) by mouth at bedtime. 90 tablet 3   No current facility-administered medications for this visit.    Allergies-reviewed and updated No Known Allergies  Social History   Socioeconomic History  . Marital status: Married    Spouse name: Not on file  . Number of children: Not on file  . Years of education: Not on file  . Highest education level: Not on file  Occupational History  . Not on file  Tobacco Use  . Smoking status: Current Some Day Smoker    Packs/day: 0.75    Years: 4.00    Pack years: 3.00    Types: Cigarettes, Cigars    Last attempt to quit: 03/11/1997    Years since quitting: 22.5  . Smokeless tobacco: Never Used  Substance and Sexual Activity  . Alcohol use: Yes    Alcohol/week: 10.0 standard drinks    Types: 10 Standard drinks or equivalent per week  . Drug use: No  . Sexual activity: Not on file  Other Topics Concern  . Not on file  Social History Narrative   Married (wife patient outside practice) 3 children, 1 step child. 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        Hobbies: formerly played hockey-wants to get back in, golf, plays music   Social Determinants of Health   Financial Resource Strain:   . Difficulty of Paying Living Expenses: Not on file  Food Insecurity:   . Worried About Charity fundraiser in the Last Year: Not on file  . Ran Out of Food in the Last Year: Not on file  Transportation Needs:   . Lack of Transportation (Medical): Not on file  . Lack of Transportation (Non-Medical): Not on file  Physical Activity:   . Days of Exercise per Week: Not on file  . Minutes of Exercise per Session: Not on file  Stress:   . Feeling of Stress : Not on file  Social Connections:   . Frequency of Communication with Friends and Family: Not on file  . Frequency of Social Gatherings with Friends and Family: Not on file  . Attends Religious Services: Not on file  . Active Member of Clubs or Organizations: Not on file  . Attends Archivist Meetings: Not on file  . Marital Status: Not on file   Objective  Objective:  There were no vitals taken for this visit. Gen: NAD, resting comfortably HEENT: Mucous membranes are moist. Oropharynx normal Neck: no thyromegaly CV: RRR no murmurs rubs or gallops Lungs: CTAB no crackles, wheeze, rhonchi Abdomen: soft/nontender/nondistended/normal bowel sounds. No rebound or guarding.  Ext: no edema Skin: warm, dry Neuro: grossly normal, moves all extremities, PERRLA   Assessment and Plan:   Welcome to Medicare exam completed-  1. Educated, counseled and referred based on above elements 2. Educated, counseled and referred as appropriate for preventative needs 3. Discussed and documented a written plan for preventiative services and screenings with  personalized health advice- After Visit Summary was given to patient which included this plan *** 4. EKG offered [id number]- patient ***  Status of chronic or acute concerns  *** #hypertension S: compliant with ***losartan 100 mg daily.  Does not  check blood pressure regularly at home A/P: ***   #hyperlipidemia S: compliant with ***simvastatin 40 mg. A/P: ***   # Hyperglycemia/insulin resistance/prediabetes-family history of diabetes S: Previously controlled with diet and exercise.  Does not check blood sugars.  Noted to have elevated A1c August 2020 at 5.7. A/P: ***  #Obstructive sleep apnea-unfortunately unable to tolerate CPAP***   % Vertigo-BPPV versus Mnire's per Dr. Elspeth Cho in the spring***   No problem-specific Assessment & Plan notes found for this encounter.   Recommended follow up: *** Future Appointments  Date Time Provider [place]  09/27/2019 10:00 AM Marin Olp, MD [facility]     Lab/Order associations: No diagnosis found.  No orders of the defined types were placed in this encounter.   Return precautions advised. Clyde Lundborg, CMA

## 2019-10-17 NOTE — Progress Notes (Signed)
Phone: [phone]   Subjective:  Patient presents today for their Welcome to Medicare Exam    Preventive Screening-Counseling & Management  Vision screen: normal  Hearing Screening   125Hz  250Hz  500Hz  1000Hz  2000Hz  3000Hz  4000Hz  6000Hz  8000Hz   Right ear:           Left ear:           Comments: Left ear- 122 Pass Right ear- 123 Pass   Visual Acuity Screening   Right eye Left eye Both eyes  Without correction:     With correction: 20/20 20/20    Advanced directives: wife HCPOA. Full code but would not want care in vegetative state/extraodinary measures.   Modifiable Risk Factors/behavioral risk assessment/psychosocial risk assessment Regular exercise: walks maybe a day a week- encouraged to increase. Also active on the weekends. Quit playing hockey so somewhat less active. Is trying to do some golf Diet: down over 10 lbs from peak weight. Trying to eat reasonably healthy. Trying to monitor bread intake. Feels metabolism has slowed.  Wt Readings from Last 3 Encounters:  10/18/19 215 lb 12.8 oz (97.9 kg)  09/13/19 209 lb (94.8 kg)  02/16/19 209 lb 9.6 oz (95.1 kg)  Smoking Status: cigarettes in college, occasional cigar- encouraged cessation.  Second Hand Smoking status: No smokers in home Alcohol intake:  10 per week.   Cardiac risk factors:  advanced age (older than 24 for men, 63 for women)  treated Hyperlipidemia  treated Hypertension  No diabetes. Has had elevated a1c in past but improved last check Lab Results  Component Value Date   HGBA1C 4.9 02/16/2019  Family History: dad with heart attack but was heavy smoker   Depression Screen/risk evaluation Risk factors: none.[name] Kitchen PHQ2 0  Depression screen [facility] 2/9 10/18/2019 02/16/2019 08/18/2018 07/23/2017 03/13/2016  Decreased Interest 0 0 0 0 0  Down, Depressed, Hopeless 0 0 0 0 0  PHQ - 2 Score 0 0 0 0 0  Altered sleeping - 0 - - -  Tired, decreased energy - 0 - - -  Change in appetite - 0 - - -  Feeling bad or failure  about yourself  - 0 - - -  Trouble concentrating - 0 - - -  Moving slowly or fidgety/restless - 0 - - -  Suicidal thoughts - 0 - - -  PHQ-9 Score - 0 - - -  Difficult doing work/chores - Not difficult at all - - -    Functional ability and level of safety Mobility assessment:  timed get up and go <12 seconds Activities of Daily Living- Independent in ADLs (toileting, bathing, dressing, transferring, eating) and in IADLs (shopping, housekeeping, managing own medications, and handling finances) Home Safety: Loose rugs (no), smoke detectors (up to date), small pets (no), grab bars (plastic grab bars), stairs (1 set no issues), life-alert system (would use cell phone) Hearing Difficulties: -patient declines Fall Risk: None  Fall Risk  10/18/2019 02/16/2019 05/28/2015  Falls in the past year? 0 1 No  Number falls in past yr: - 0 -  Injury with Fall? - 1 -  Risk for fall due to : - Other (Comment) -  Follow up - Education provided -  Opioid use history:  no long term opioids use Self assessment of health status: "pretty good"  Required Immunizations needed today:  flu shot yesterday. Pneumovax at later date at least 2 weeks from flu shot- can do next visit if needed Immunization History  Administered Date(s) Administered  . Fluad  Quad(high Dose 65+) 10/17/2019  . Influenza Split 11/04/2010, 11/04/2011  . Influenza Whole 12/07/2006, 11/05/2008, 10/17/2009  . Influenza,inj,Quad PF,6+ Mos 11/30/2012, 09/30/2016, 10/30/2017, 10/08/2018  . Influenza-Unspecified 12/16/2013, 10/02/2014, 09/12/2015, 10/30/2017  . Moderna SARS-COVID-2 Vaccination 02/10/2019, 03/11/2019  . Td 06/06/2006  . Tdap 06/08/2017  . Zoster Recombinat (Shingrix) 08/18/2018, 11/15/2018   Health Maintenance  Topic Date Due  . Pneumonia vaccines (1 of 2 - PCV13) Never done  . Colon Cancer Screening  09/21/2025  . Tetanus Vaccine  06/09/2027  . Flu Shot  Completed  . COVID-19 Vaccine  Completed  .  Hepatitis C: One time  screening is recommended by Center for Disease Control  (CDC) for  adults born from [birth year] through 1966.   Completed  . HIV Screening  Completed   Screening tests-  1. Colon cancer screening- 09/22/2018 with 7 year repeat planned 2. Lung Cancer screening- not a candidate 3. Skin cancer screening- refer to dermatology today 4. Prostate cancer screening- low risk prior PSA trend. Did have a jump in august 2020 but came back down on 1 month repeat. Update again today Lab Results  Component Value Date   PSA 2.03 09/21/2018   PSA 3.20 08/18/2018   PSA 2.09 07/23/2017   The following were reviewed and entered/updated in epic: Past Medical History:  Diagnosis Date  . Allergy   . Cataract    removed both eyes  . Hyperlipidemia    currently under ontrol   . Hypertension   . Sleep apnea    no cpap    Patient Active Problem List   Diagnosis Date Noted  . Hyperglycemia 02/04/2015    Priority: Medium  . Essential hypertension 03/25/2007    Priority: Medium  . Hyperlipidemia 07/29/2006    Priority: Medium  . Former smoker 03/06/2014    Priority: Low  . Insomnia 03/06/2014    Priority: Low  . Obstructive sleep apnea 05/28/2012    Priority: Low  . Seasonal and perennial allergic rhinitis 06/21/2009    Priority: Low  . Meniere disease, bilateral 07/24/2017   Past Surgical History:  Procedure Laterality Date  . APPENDECTOMY    . cataract lens implants     bilateral  . COLONOSCOPY     x2 last 2010 1 HPP   . DENTAL SURGERY    . SINUS IRRIGATION  2003    Family History  Problem Relation Age of Onset  . Heart attack Father        40s, heavy smoker  . Diabetes Brother        x2  . GER disease Mother   . Breast cancer Mother   . Hypertension Brother        x2  . Nephrolithiasis Daughter   . Colon polyps Neg Hx   . Colon cancer Neg Hx   . Esophageal cancer Neg Hx   . Rectal cancer Neg Hx   . Stomach cancer Neg Hx     Medications- reviewed and updated Current Outpatient  Medications  Medication Sig Dispense Refill  . losartan (COZAAR) 100 MG tablet Take 1 tablet (100 mg total) by mouth daily. 90 tablet 3  . simvastatin (ZOCOR) 40 MG tablet Take 1 tablet (40 mg total) by mouth at bedtime. 90 tablet 3   No current facility-administered medications for this visit.    Allergies-reviewed and updated No Known Allergies  Social History   Socioeconomic History  . Marital status: Married    Spouse name: Not on file  . Number  of children: Not on file  . Years of education: Not on file  . Highest education level: Not on file  Occupational History  . Not on file  Tobacco Use  . Smoking status: Current Some Day Smoker    Packs/day: 0.75    Years: 4.00    Pack years: 3.00    Types: Cigarettes, Cigars    Last attempt to quit: 03/11/1997    Years since quitting: 22.6  . Smokeless tobacco: Never Used  Substance and Sexual Activity  . Alcohol use: Yes    Alcohol/week: 10.0 standard drinks    Types: 10 Standard drinks or equivalent per week  . Drug use: No  . Sexual activity: Not on file  Other Topics Concern  . Not on file  Social History Narrative   Married (wife patient outside practice) 3 children, 1 step child. 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       Hobbies: formerly played hockey-wants to get back in, golf, plays music   Social Determinants of Health   Financial Resource Strain:   . Difficulty of Paying Living Expenses: Not on file  Food Insecurity: No Food Insecurity  . Worried About Charity fundraiser in the Last Year: Never true  . Ran Out of Food in the Last Year: Never true  Transportation Needs: No Transportation Needs  . Lack of Transportation (Medical): No  . Lack of Transportation (Non-Medical): No  Physical Activity: Insufficiently Active  . Days of Exercise per Week: 1 day  . Minutes of Exercise per Session: 30 min  Stress: No Stress Concern Present  . Feeling of Stress : Only a little  Social Connections:  Socially Integrated  . Frequency of Communication with Friends and Family: More than three times a week  . Frequency of Social Gatherings with Friends and Family: More than three times a week  . Attends Religious Services: More than 4 times per year  . Active Member of Clubs or Organizations: Yes  . Attends Archivist Meetings: More than 4 times per year  . Marital Status: Married   Objective  Objective:  BP 108/82   Pulse 67   Temp 98.5 F (36.9 C) (Temporal)   Resp 18   Ht 5\' 7"  (1.702 m)   Wt 215 lb 12.8 oz (97.9 kg)   SpO2 95%   BMI 33.80 kg/m  Gen: NAD, resting comfortably HEENT: Mucous membranes are moist. Oropharynx normal Neck: no thyromegaly CV: RRR no murmurs rubs or gallops Lungs: CTAB no crackles, wheeze, rhonchi Abdomen: soft/nontender/nondistended/normal bowel sounds. No rebound or guarding. overweight Ext: no edema Skin: warm, dry Neuro: grossly normal, moves all extremities, PERRLA    Assessment and Plan:   Welcome to Medicare exam completed-  1. Educated, counseled and referred based on above elements 2. Educated, counseled and referred as appropriate for preventative needs 3. Discussed and documented a written plan for preventiative services and screenings with personalized health advice- After Visit Summary was given to patient which included this plan  4. EKG offered [id number]- patient wants this but technician not available- will complete at next visit  Status of chronic or acute concerns   #hypertension S: compliant with losartan 100 mg daily.  Does not check blood pressure regularly at home A/P: Stable. Continue current medications.    #hyperlipidemia S: compliant with simvastatin 40 mg. Lab Results  Component Value Date   CHOL 142 08/18/2018   HDL 56.10 08/18/2018  LDLCALC 61 08/18/2018   TRIG 126.0 08/18/2018   CHOLHDL 3 08/18/2018  A/P: ldl below 70 which is ideal- update lipid panel today   # Hyperglycemia/insulin  resistance/prediabetes-family history of diabetes S: Previously controlled with diet and exercise.  Does not check blood sugars.  Noted to have elevated A1c August 2020 at 5.7. Lab Results  Component Value Date   HGBA1C 4.9 02/16/2019  A/P: substantial improvement with weight loss- wants to update with labs  #Obstructive sleep apnea-unfortunately unable to tolerate CPAP. Tolerates 4 hours of sleep at time then can get another 2 hours then perhaps another 1 hour. CBD gummies half of full spectrum helps him 10mg - and 10 mg regular CBD sleeping some better.   % Vertigo-BPPV versus Mnire's (not formally diagnosed) per Dr. Elspeth Cho in the spring. Still with mild dizziness in AM- has had some inner ear issues. Hit head on carpeted floor but had a good knot last summer 2020. Has had issues prior to that. Has had BPPV before. Has seen Dr. Constance Holster in past. Has some tinnitus. Not worsening. There has been question about Meniere's but not diagnosed- will monitor   #floaters- encouraged him to get follow up with optho- they are aware of already.   Recommended follow up: 6 months physical    Lab/Order associations:   ICD-10-CM   1. Preventative health care  Z00.00   2. Hyperlipidemia, unspecified hyperlipidemia type  E78.5 CBC With Differential/Platelet    COMPLETE METABOLIC PANEL WITH GFR    Lipid Panel (Refl)    Lipid Panel (Refl)    COMPLETE METABOLIC PANEL WITH GFR    CBC With Differential/Platelet  3. Hyperglycemia  R73.9 Hemoglobin A1c    Hemoglobin A1c  4. Nocturia  R35.1 PSA    PSA  5. Screening for prostate cancer  Z12.5 PSA    PSA  6. Screening exam for skin cancer  Z12.83 Ambulatory referral to Dermatology  7. Former smoker  Z87.891 US AORTA MEDICARE SCREENING  8. Screening for AAA (abdominal aortic aneurysm)  Z13.6 US AORTA MEDICARE SCREENING    No orders of the defined types were placed in this encounter.   Return precautions advised. Garret Reddish, MD

## 2019-10-17 NOTE — Patient Instructions (Addendum)
Health Maintenance Due  Topic Date Due  . Pneumovax 23 at later date at least 2 weeks from flu shot- can do next visit if needed or at pharmacy. I do not recommend prevnar 13 for you since you are lower risk for pneumococcal disease Never done   We will call you within two weeks about your referral for aneurysm screening and for dermatology. If you do not hear within 3 weeks, give [place] a call.    Scott Neal , Thank you for taking time to come for your Medicare Wellness Visit. I appreciate your ongoing commitment to your health goals. Please review the following plan we discussed and let me know if I can assist you in the future.   These are the goals we discussed: 1. 150 minutes exercise per week 2.  We also verbally discussed the weight goal of losing 5 to 10 pounds by follow-up  This is a list of the screening recommended for you and due dates:  Health Maintenance  Topic Date Due  . Pneumonia vaccines- pneumovax 23 Never done  . Colon Cancer Screening  09/21/2025  . Tetanus Vaccine  06/09/2027  . Flu Shot  Completed  . COVID-19 Vaccine  Completed  .  Hepatitis C: One time screening is recommended by Center for Disease Control  (CDC) for  adults born from [birth year] through 1966.   Completed  . HIV Screening  Completed

## 2019-10-18 ENCOUNTER — Other Ambulatory Visit: Payer: Self-pay

## 2019-10-18 ENCOUNTER — Encounter: Payer: Self-pay | Admitting: Family Medicine

## 2019-10-18 ENCOUNTER — Ambulatory Visit (INDEPENDENT_AMBULATORY_CARE_PROVIDER_SITE_OTHER): Payer: Medicare PPO | Admitting: Family Medicine

## 2019-10-18 VITALS — BP 108/82 | HR 67 | Temp 98.5°F | Resp 18 | Ht 67.0 in | Wt 215.8 lb

## 2019-10-18 DIAGNOSIS — Z125 Encounter for screening for malignant neoplasm of prostate: Secondary | ICD-10-CM | POA: Diagnosis not present

## 2019-10-18 DIAGNOSIS — Z0000 Encounter for general adult medical examination without abnormal findings: Secondary | ICD-10-CM

## 2019-10-18 DIAGNOSIS — R351 Nocturia: Secondary | ICD-10-CM

## 2019-10-18 DIAGNOSIS — Z136 Encounter for screening for cardiovascular disorders: Secondary | ICD-10-CM | POA: Diagnosis not present

## 2019-10-18 DIAGNOSIS — Z87891 Personal history of nicotine dependence: Secondary | ICD-10-CM

## 2019-10-18 DIAGNOSIS — E785 Hyperlipidemia, unspecified: Secondary | ICD-10-CM

## 2019-10-18 DIAGNOSIS — R739 Hyperglycemia, unspecified: Secondary | ICD-10-CM | POA: Diagnosis not present

## 2019-10-18 DIAGNOSIS — Z1283 Encounter for screening for malignant neoplasm of skin: Secondary | ICD-10-CM

## 2019-10-19 LAB — LIPID PANEL (REFL)
Cholesterol: 163 mg/dL (ref <200)
HDL: 69 mg/dL (ref >=40)
LDL Cholesterol (Calc): 79 mg/dL (calc)
Non-HDL Cholesterol (Calc): 94 mg/dL (calc) (ref <130)
Total CHOL/HDL Ratio: 2.4 (calc) (ref <5.0)
Triglycerides: 70 mg/dL (ref <150)

## 2019-10-19 LAB — HEMOGLOBIN A1C
Hgb A1c MFr Bld: 5.6 % of total Hgb (ref <5.7)
Mean Plasma Glucose: 114 (calc)
eAG (mmol/L): 6.3 (calc)

## 2019-10-19 LAB — COMPLETE METABOLIC PANEL WITH GFR
AG Ratio: 2.3 (calc) (ref 1.0–2.5)
ALT: 22 U/L (ref 9–46)
AST: 15 U/L (ref 10–35)
Albumin: 4.6 g/dL (ref 3.6–5.1)
Alkaline phosphatase (APISO): 43 U/L (ref 35–144)
BUN: 13 mg/dL (ref 7–25)
CO2: 27 mmol/L (ref 20–32)
Calcium: 9.5 mg/dL (ref 8.6–10.3)
Chloride: 106 mmol/L (ref 98–110)
Creat: 0.82 mg/dL (ref 0.70–1.25)
GFR, Est African American: 108 mL/min/{1.73_m2} (ref >=60)
GFR, Est Non African American: 93 mL/min/{1.73_m2} (ref >=60)
Globulin: 2 g/dL (calc) (ref 1.9–3.7)
Glucose, Bld: 99 mg/dL (ref 65–99)
Potassium: 5.2 mmol/L (ref 3.5–5.3)
Sodium: 140 mmol/L (ref 135–146)
Total Bilirubin: 0.7 mg/dL (ref 0.2–1.2)
Total Protein: 6.6 g/dL (ref 6.1–8.1)

## 2019-10-19 LAB — CBC WITH DIFFERENTIAL/PLATELET
Absolute Monocytes: 754 cells/uL (ref 200–950)
Basophils Absolute: 41 cells/uL (ref 0–200)
Basophils Relative: 0.7 %
Eosinophils Absolute: 180 cells/uL (ref 15–500)
Eosinophils Relative: 3.1 %
HCT: 41.8 % (ref 38.5–50.0)
Hemoglobin: 14 g/dL (ref 13.2–17.1)
Lymphs Abs: 1380 cells/uL (ref 850–3900)
MCH: 31.9 pg (ref 27.0–33.0)
MCHC: 33.5 g/dL (ref 32.0–36.0)
MCV: 95.2 fL (ref 80.0–100.0)
MPV: 10.4 fL (ref 7.5–12.5)
Monocytes Relative: 13 %
Neutro Abs: 3445 cells/uL (ref 1500–7800)
Neutrophils Relative %: 59.4 %
Platelets: 201 10*3/uL (ref 140–400)
RBC: 4.39 10*6/uL (ref 4.20–5.80)
RDW: 12.1 % (ref 11.0–15.0)
Total Lymphocyte: 23.8 %
WBC: 5.8 10*3/uL (ref 3.8–10.8)

## 2019-10-19 LAB — PSA: PSA: 2.19 ng/mL (ref <=4.0)

## 2019-11-19 ENCOUNTER — Ambulatory Visit: Payer: Medicare PPO

## 2019-11-19 ENCOUNTER — Ambulatory Visit: Payer: Medicare PPO | Attending: Internal Medicine

## 2019-11-19 DIAGNOSIS — Z23 Encounter for immunization: Secondary | ICD-10-CM

## 2019-11-19 NOTE — Progress Notes (Signed)
   Covid-19 Vaccination Clinic  Name:  Scott Neal    MRN: [id number] DOB: [birth year]  11/19/2019  Scott Neal was observed post Covid-19 immunization for 15 minutes without incident. He was provided with Vaccine Information Sheet and instruction to access the V-Safe system.   Scott Neal was instructed to call 911 with any severe reactions post vaccine: [name] Kitchen Difficulty breathing  . Swelling of face and throat  . A fast heartbeat  . A bad rash all over body  . Dizziness and weakness   Immunizations Administered    No immunizations on file.

## 2020-01-20 ENCOUNTER — Other Ambulatory Visit: Payer: Medicare PPO

## 2020-01-20 DIAGNOSIS — Z20822 Contact with and (suspected) exposure to covid-19: Secondary | ICD-10-CM

## 2020-01-23 LAB — NOVEL CORONAVIRUS, NAA: SARS-CoV-2, NAA: NOT DETECTED

## 2020-02-14 ENCOUNTER — Other Ambulatory Visit: Payer: Self-pay | Admitting: Family Medicine

## 2020-03-19 ENCOUNTER — Ambulatory Visit: Payer: Medicare PPO | Admitting: Dermatology

## 2020-04-29 NOTE — Progress Notes (Signed)
Phone: [phone]   Subjective:  Patient presents today for their annual physical. Chief complaint-noted.   See problem oriented charting- ROS- full  review of systems was completed and negative  except for: sneezing, tinnitus, seasonal allergies, dizziness intermittent   The following were reviewed and entered/updated in epic: Past Medical History:  Diagnosis Date  . Allergy   . Cataract    removed both eyes  . Hyperlipidemia    currently under ontrol   . Hypertension   . Sleep apnea    no cpap    Patient Active Problem List   Diagnosis Date Noted  . Hyperglycemia 02/04/2015    Priority: Medium  . Essential hypertension 03/25/2007    Priority: Medium  . Hyperlipidemia 07/29/2006    Priority: Medium  . Former smoker 03/06/2014    Priority: Low  . Insomnia 03/06/2014    Priority: Low  . Obstructive sleep apnea 05/28/2012    Priority: Low  . Seasonal and perennial allergic rhinitis 06/21/2009    Priority: Low  . Meniere disease, bilateral 07/24/2017   Past Surgical History:  Procedure Laterality Date  . APPENDECTOMY    . cataract lens implants     bilateral  . COLONOSCOPY     x2 last 2010 1 HPP   . DENTAL SURGERY    . SINUS IRRIGATION  2003    Family History  Problem Relation Age of Onset  . Heart attack Father        72s, heavy smoker  . Diabetes Brother        x2  . GER disease Mother   . Breast cancer Mother   . Hypertension Brother        x2  . Nephrolithiasis Daughter   . Colon polyps Neg Hx   . Colon cancer Neg Hx   . Esophageal cancer Neg Hx   . Rectal cancer Neg Hx   . Stomach cancer Neg Hx     Medications- reviewed and updated Current Outpatient Medications  Medication Sig Dispense Refill  . losartan (COZAAR) 100 MG tablet TAKE ONE TABLET BY MOUTH DAILY 90 tablet 3  . simvastatin (ZOCOR) 40 MG tablet TAKE ONE TABLET BY MOUTH AT BEDTIME 90 tablet 3   No current facility-administered medications for this visit.     Allergies-reviewed and updated No Known Allergies  Social History   Social History Narrative   Married (wife patient outside practice) 3 children, 1 step child. 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       Hobbies: formerly played hockey-wants to get back in, golf, plays music   Objective  Objective:  BP 124/72   Pulse 76   Temp 98.2 F (36.8 C)   Ht 5\' 7"  (1.702 m)   Wt 222 lb 6.4 oz (100.9 kg)   SpO2 97%   BMI 34.83 kg/m  Gen: NAD, resting comfortably HEENT: Mucous membranes are moist. Oropharynx normal Neck: no thyromegaly CV: RRR no murmurs rubs or gallops Lungs: CTAB no crackles, wheeze, rhonchi Abdomen: soft/nontender/nondistended/normal bowel sounds. No rebound or guarding.  Ext: no edema Skin: warm, dry Neuro: grossly normal, moves all extremities, PERRLA  EKG: sinus rhythm with rate 67, normal axis, normal intervals, no hypertrophy, no st or t wave changes    Assessment and Plan  65 y.o. male presenting for annual physical.  Health Maintenance counseling: 1. Anticipatory guidance: Patient counseled regarding regular dental exams -q3 months, eye exams -needs to schedule yearly- no new issues,  avoiding smoking and second hand smoke , limiting alcohol to 2 beverages per day , no illicit drugs.   2. Risk factor reduction:  Advised patient of need for regular exercise and diet rich and fruits and vegetables to reduce risk of heart attack and stroke. 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 Diet- Up 13 lbs from last February- tough holiday season with a lot of travel. Bread and pizza are his nemesis. stablized with recent calorie reduction/portion size reduction- discussed working on this Wt Readings from Last 3 Encounters:  05/01/20 222 lb 6.4 oz (100.9 kg)  10/18/19 215 lb 12.8 oz (97.9 kg)  09/13/19 209 lb (94.8 kg)  3.  Immunizations/screenings/ancillary studies- no pneumonia vaccine on file- discussed newer prevnar 20- is going to check with insurance- can call back for nurse visit if covered. moderna covid vaccine with last shot in November- discussed optional 4th - he is planning on this Immunization History  Administered Date(s) Administered  . Fluad Quad(high Dose 65+) 10/17/2019  . Influenza Split 11/04/2010, 11/04/2011  . Influenza Whole 12/07/2006, 11/05/2008, 10/17/2009  . Influenza,inj,Quad PF,6+ Mos 11/30/2012, 09/30/2016, 10/30/2017, 10/08/2018  . Influenza-Unspecified 12/16/2013, 10/02/2014, 09/12/2015, 10/30/2017  . Moderna SARS-COV2 Booster Vaccination 11/19/2019  . Moderna Sars-Covid-2 Vaccination 02/10/2019, 03/11/2019  . Td 06/06/2006  . Tdap 06/08/2017  . Zoster Recombinat (Shingrix) 08/18/2018, 11/15/2018   4. Prostate cancer screening-  psa trend overall low risk- trend with labs today to line up with CPE. Does have nocturia- defer rectal unless psa trend concerning Lab Results  Component Value Date   PSA 2.19 10/18/2019   PSA 2.03 09/21/2018   PSA 3.20 08/18/2018   5. Colon cancer screening - 09/22/2018 with 7 year follow up planned with tubular adenoma 6. Skin cancer screening-  Trying to get scheduled for visit. advised regular sunscreen use. Denies worrisome, changing, or new skin lesions.  7. FORMER smoker- also still using cigars- recommended cessation but not ready to quit. Will get UA. Under 20 pack years by far. Discussed AAA scan- wants to hold off until next visit 8. STD screening -  only active with wife  Status of chronic or acute concerns   #hypertension S: compliant with losartan 100 mg daily.  Does not check blood pressure regularly at home BP Readings from Last 3 Encounters:  05/01/20 124/72  10/18/19 108/82  02/16/19 120/68  A/P: Stable. Continue current medications.    #hyperlipidemia S: compliant with simvastatin 40 mg. -dad with heart attack in 50s but  heavy smoker Lab Results  Component Value Date   CHOL 163 10/18/2019   HDL 69 10/18/2019   LDLCALC 79 10/18/2019   TRIG 70 10/18/2019   CHOLHDL 2.4 10/18/2019   A/P: LDL well controlled on last check-will update lipids- non fatsing at moment   # Hyperglycemia/insulin resistance/prediabetes-family history of diabetes S: Previously controlled with diet and exercise.  Does not check blood sugars.  Noted to have elevated A1c August 2020 at 5.7. Lab Results  Component Value Date   HGBA1C 5.6 10/18/2019   HGBA1C 4.9 02/16/2019   HGBA1C 5.7 08/18/2018  A/P: hopefully stable and not back in prediabetes range- update today  #OSA/insomnia-unable to tolerate CPAP at all-4  Hours sleep max and may get 1-2 more hours.  CBD Gummies helps some- has had to increase dose  % Vertigo-BPPV versus Mnire's per Dr. Elspeth Cho in the spring- has  been noting that for sometime (more towards BPPV)- monitoring for now. No severe issues recently  #floaters last visit and advised optho follow up - monitoring with them  Recommended follow up: Return in about 6 months (around 10/31/2020) for follow up- or sooner if needed. Future Appointments  Date Time Provider [place]  05/28/2020  9:30 AM Lavonna Monarch, MD [facility]   Lab/Order associations: NOT  fasting   ICD-10-CM   1. Preventative health care  Z00.00   2. Essential hypertension  I10   3. Hyperlipidemia, unspecified hyperlipidemia type  E78.5   4. Hyperglycemia  R73.9   5. Former smoker  Z87.891   7. Screening for prostate cancer  Z12.5   7. Nocturia  R35.1   8. Cigar smoker  F17.290     No orders of the defined types were placed in this encounter.   Return precautions advised.  Garret Reddish, MD

## 2020-04-29 NOTE — Patient Instructions (Addendum)
Please stop by lab before you go If you have mychart- we will send your results within 3 business days of [place] receiving them.  If you do not have mychart- we will call you about results within 5 business days of [place] receiving them.  *please also note that you will see labs on mychart as soon as they post. I will later go in and write notes on them- will say "notes from Dr. Yong Channel"  Recommend prevnar 20- check with insurance- call for nurse visit if scheduled  Let us know if you get 4th covid vaccine  [email or web address]  Team please do EKG before he leaves -I will send message with results  We will call you within two weeks about your referral to coronary calcium scoring. If you do not hear within 2 weeks, give [place] a call.

## 2020-05-01 ENCOUNTER — Encounter: Payer: Self-pay | Admitting: Family Medicine

## 2020-05-01 ENCOUNTER — Ambulatory Visit (INDEPENDENT_AMBULATORY_CARE_PROVIDER_SITE_OTHER): Payer: Medicare PPO | Admitting: Family Medicine

## 2020-05-01 ENCOUNTER — Other Ambulatory Visit: Payer: Self-pay

## 2020-05-01 VITALS — BP 124/72 | HR 76 | Temp 98.2°F | Ht 67.0 in | Wt 222.4 lb

## 2020-05-01 DIAGNOSIS — R351 Nocturia: Secondary | ICD-10-CM

## 2020-05-01 DIAGNOSIS — E785 Hyperlipidemia, unspecified: Secondary | ICD-10-CM

## 2020-05-01 DIAGNOSIS — Z0000 Encounter for general adult medical examination without abnormal findings: Secondary | ICD-10-CM

## 2020-05-01 DIAGNOSIS — Z87891 Personal history of nicotine dependence: Secondary | ICD-10-CM

## 2020-05-01 DIAGNOSIS — I10 Essential (primary) hypertension: Secondary | ICD-10-CM

## 2020-05-01 DIAGNOSIS — F17290 Nicotine dependence, other tobacco product, uncomplicated: Secondary | ICD-10-CM | POA: Diagnosis not present

## 2020-05-01 DIAGNOSIS — Z125 Encounter for screening for malignant neoplasm of prostate: Secondary | ICD-10-CM

## 2020-05-01 DIAGNOSIS — R739 Hyperglycemia, unspecified: Secondary | ICD-10-CM

## 2020-05-01 LAB — CBC WITH DIFFERENTIAL/PLATELET
Basophils Absolute: 0 10*3/uL (ref 0.0–0.1)
Basophils Relative: 0.8 % (ref 0.0–3.0)
Eosinophils Absolute: 0.3 10*3/uL (ref 0.0–0.7)
Eosinophils Relative: 5.5 % — ABNORMAL HIGH (ref 0.0–5.0)
HCT: 40.6 % (ref 39.0–52.0)
Hemoglobin: 13.7 g/dL (ref 13.0–17.0)
Lymphocytes Relative: 17.5 % (ref 12.0–46.0)
Lymphs Abs: 1 10*3/uL (ref 0.7–4.0)
MCHC: 33.7 g/dL (ref 30.0–36.0)
MCV: 95.6 fl (ref 78.0–100.0)
Monocytes Absolute: 0.7 10*3/uL (ref 0.1–1.0)
Monocytes Relative: 11.4 % (ref 3.0–12.0)
Neutro Abs: 3.7 10*3/uL (ref 1.4–7.7)
Neutrophils Relative %: 64.8 % (ref 43.0–77.0)
Platelets: 206 10*3/uL (ref 150.0–400.0)
RBC: 4.25 Mil/uL (ref 4.22–5.81)
RDW: 13.3 % (ref 11.5–15.5)
WBC: 5.8 10*3/uL (ref 4.0–10.5)

## 2020-05-01 LAB — COMPREHENSIVE METABOLIC PANEL
ALT: 25 U/L (ref 0–53)
AST: 18 U/L (ref 0–37)
Albumin: 4.4 g/dL (ref 3.5–5.2)
Alkaline Phosphatase: 42 U/L (ref 39–117)
BUN: 17 mg/dL (ref 6–23)
CO2: 23 mEq/L (ref 19–32)
Calcium: 9.1 mg/dL (ref 8.4–10.5)
Chloride: 105 mEq/L (ref 96–112)
Creatinine, Ser: 0.95 mg/dL (ref 0.40–1.50)
GFR: 83.53 mL/min (ref >60.00)
Glucose, Bld: 106 mg/dL — ABNORMAL HIGH (ref 70–99)
Potassium: 4.3 mEq/L (ref 3.5–5.1)
Sodium: 138 mEq/L (ref 135–145)
Total Bilirubin: 0.6 mg/dL (ref 0.2–1.2)
Total Protein: 6.5 g/dL (ref 6.0–8.3)

## 2020-05-01 LAB — LIPID PANEL
Cholesterol: 145 mg/dL (ref 0–200)
HDL: 55.1 mg/dL (ref >39.00)
LDL Cholesterol: 66 mg/dL (ref 0–99)
NonHDL: 90.26
Total CHOL/HDL Ratio: 3
Triglycerides: 120 mg/dL (ref 0.0–149.0)
VLDL: 24 mg/dL (ref 0.0–40.0)

## 2020-05-01 LAB — POC URINALSYSI DIPSTICK (AUTOMATED)
Bilirubin, UA: NEGATIVE
Blood, UA: NEGATIVE
Glucose, UA: NEGATIVE
Ketones, UA: NEGATIVE
Leukocytes, UA: NEGATIVE
Nitrite, UA: NEGATIVE
Protein, UA: NEGATIVE
Spec Grav, UA: 1.025 (ref 1.010–1.025)
Urobilinogen, UA: 0.2 E.U./dL
pH, UA: 5.5 (ref 5.0–8.0)

## 2020-05-01 LAB — HEMOGLOBIN A1C: Hgb A1c MFr Bld: 5.9 % (ref 4.6–6.5)

## 2020-05-01 LAB — PSA: PSA: 2.24 ng/mL (ref 0.10–4.00)

## 2020-05-06 DIAGNOSIS — H16201 Unspecified keratoconjunctivitis, right eye: Secondary | ICD-10-CM | POA: Diagnosis not present

## 2020-05-08 NOTE — Patient Instructions (Addendum)
I do not see a clear dental abscess on exam.  You are extremely tender to your left maxillary sinus which is concerning for me for a sinus infection-with severity of your pain and with progression of pain I think we should try Augmentin.  I am hoping you will improve within 24 to 48 hours-if not improving by Monday please let us know.  Could also have Dr. Norberta Keens take a look if not doing better.   Recommended follow up: Return for as needed for new, worsening, persistent symptoms.

## 2020-05-09 ENCOUNTER — Encounter: Payer: Self-pay | Admitting: Family Medicine

## 2020-05-09 ENCOUNTER — Ambulatory Visit: Payer: Medicare PPO | Admitting: Family Medicine

## 2020-05-09 ENCOUNTER — Other Ambulatory Visit: Payer: Self-pay

## 2020-05-09 VITALS — BP 134/82 | HR 72 | Temp 97.9°F | Ht 67.0 in | Wt 220.0 lb

## 2020-05-09 DIAGNOSIS — J0100 Acute maxillary sinusitis, unspecified: Secondary | ICD-10-CM | POA: Diagnosis not present

## 2020-05-09 DIAGNOSIS — I10 Essential (primary) hypertension: Secondary | ICD-10-CM | POA: Diagnosis not present

## 2020-05-09 MED ORDER — AMOXICILLIN-POT CLAVULANATE 875-125 MG PO TABS: 1.0000 | ORAL_TABLET | Freq: Two times a day (BID) | ORAL | 0 refills | Status: AC

## 2020-05-09 NOTE — Progress Notes (Signed)
Phone [phone] In person visit   Subjective:   Scott Neal is a 65 y.o. year old very pleasant male patient who presents for/with See problem oriented charting Chief Complaint  Patient presents with  . Cheek pain    Left cheek.Tender to the touch. Patient states that he had a toothache about 11 days ago and the pain has moved into his cheek and he's a little concerned that maybe he has some type of infection .    This visit occurred during the SARS-CoV-2 public health emergency.  Safety protocols were in place, including screening questions prior to the visit, additional usage of staff PPE, and extensive cleaning of exam room while observing appropriate contact time as indicated for disinfecting solutions.   Past Medical History-  Patient Active Problem List   Diagnosis Date Noted  . Hyperglycemia 02/04/2015    Priority: Medium  . Essential hypertension 03/25/2007    Priority: Medium  . Hyperlipidemia 07/29/2006    Priority: Medium  . Former smoker 03/06/2014    Priority: Low  . Insomnia 03/06/2014    Priority: Low  . Obstructive sleep apnea 05/28/2012    Priority: Low  . Seasonal and perennial allergic rhinitis 06/21/2009    Priority: Low  . Meniere disease, bilateral 07/24/2017    Medications- reviewed and updated Current Outpatient Medications  Medication Sig Dispense Refill  . amoxicillin-clavulanate (AUGMENTIN) 875-125 MG tablet Take 1 tablet by mouth 2 (two) times daily for 7 days. 14 tablet 0  . losartan (COZAAR) 100 MG tablet TAKE ONE TABLET BY MOUTH DAILY 90 tablet 3  . simvastatin (ZOCOR) 40 MG tablet TAKE ONE TABLET BY MOUTH AT BEDTIME 90 tablet 3   No current facility-administered medications for this visit.     Objective:  BP 134/82   Pulse 72   Temp 97.9 F (36.6 C) (Temporal)   Ht 5\' 7"  (1.702 m)   Wt 220 lb (99.8 kg)   SpO2 93%   BMI 34.46 kg/m  Gen: NAD, resting comfortably HENT: Moderate to severe tenderness in the left maxillary sinus.   Dental exam largely unremarkable- no obvious abscess or dental decay CV: RRR no murmurs rubs or gallops Lungs: CTAB no crackles, wheeze, rhonchi     Assessment and Plan  #Left facial pain S:He has pain that he believes started in left upper tooth that has radiated to his left sinus-now most of his pain is in his sinuses.  Symptoms started about 11 days ago but when he was at his last visit was not severe enough to mention.  Has worsened significantly since the beginning of this week though he rates his pain 4/10 up from 1-2/10 when this started originally. He has some sinus pain and pressure. The pain is disturbing his sleep. He has taken Ibuprofen, which provided some relief bringing it back down to 2 out of 10. He plan to follow up with his dentist if not improving further thoughts.  Denies having any fever, chills, nausea, or vomiting. A/P: Patient with severe left facial pain over left maxillary sinus concerning with continued worsening for bacterial infection-I am most concerned about a bacterial sinusitis and will treat with Augmentin.  With severity of pain I also discussed potentially doing maxillofacial CT but he would like to hold off for now and see if he improves on antibiotics.  #hypertension S: compliant with losartan 100 mg daily.  Does not check blood pressure regularly at home -last EKG 05/01/20 BP Readings from Last 3 Encounters:  05/09/20 134/82  05/01/20 124/72  10/18/19 108/82  A/P: Well-controlled on losartan despite taking   Recommended follow IR:CVELFY for as needed for new, worsening, persistent symptoms. Future Appointments  Date Time Provider [place]  05/28/2020  9:30 AM Lavonna Monarch, MD [facility]  10/30/2020  8:40 AM Yong Channel Brayton Mars, MD [facility]    Lab/Order associations:   ICD-10-CM   1. Acute non-recurrent maxillary sinusitis  J01.00   2. Essential hypertension  I10     Meds ordered this encounter  Medications  .  amoxicillin-clavulanate (AUGMENTIN) 875-125 MG tablet    Sig: Take 1 tablet by mouth 2 (two) times daily for 7 days.    Dispense:  14 tablet    Refill:  0   I,Alexis Bryant,acting as a scribe for Garret Reddish, MD.,have documented all relevant documentation on the behalf of Garret Reddish, MD,as directed by  Garret Reddish, MD while in the presence of Garret Reddish, MD.  I, Garret Reddish, MD, have reviewed all documentation for this visit. The documentation on 05/09/20 for the exam, diagnosis, procedures, and orders are all accurate and complete.   Return precautions advised.  Garret Reddish, MD

## 2020-05-10 DIAGNOSIS — H16201 Unspecified keratoconjunctivitis, right eye: Secondary | ICD-10-CM | POA: Diagnosis not present

## 2020-05-17 ENCOUNTER — Other Ambulatory Visit (HOSPITAL_BASED_OUTPATIENT_CLINIC_OR_DEPARTMENT_OTHER): Payer: Self-pay

## 2020-05-17 ENCOUNTER — Ambulatory Visit: Payer: Medicare PPO | Attending: Internal Medicine

## 2020-05-17 ENCOUNTER — Other Ambulatory Visit: Payer: Self-pay

## 2020-05-17 DIAGNOSIS — Z23 Encounter for immunization: Secondary | ICD-10-CM

## 2020-05-17 MED ORDER — COVID-19 MRNA VACC (MODERNA) 100 MCG/0.5ML IM SUSP
INTRAMUSCULAR | 0 refills | Status: DC
  Filled 2020-05-17: qty 0.25, 1d supply, fill #0

## 2020-05-17 NOTE — Progress Notes (Signed)
   Covid-19 Vaccination Clinic  Name:  Scott Neal    MRN: [id number] DOB: 1955/12/15  05/17/2020  Mr. [name] was observed post Covid-19 immunization for 15 minutes without incident. He was provided with Vaccine Information Sheet and instruction to access the V-Safe system.   Mr. [name] was instructed to call 911 with any severe reactions post vaccine: [name] Kitchen Difficulty breathing  . Swelling of face and throat  . A fast heartbeat  . A bad rash all over body  . Dizziness and weakness   Immunizations Administered    Name Date Dose VIS Date Route   Moderna Covid-19 Booster Vaccine 05/17/2020 12:14 PM 0.25 mL 10/25/2019 Intramuscular   Manufacturer: Moderna   Lot: [id number]   [place]: 76808-811-03

## 2020-05-28 ENCOUNTER — Other Ambulatory Visit: Payer: Self-pay

## 2020-05-28 ENCOUNTER — Encounter: Payer: Self-pay | Admitting: Dermatology

## 2020-05-28 ENCOUNTER — Ambulatory Visit: Payer: Medicare PPO | Admitting: Dermatology

## 2020-05-28 DIAGNOSIS — L570 Actinic keratosis: Secondary | ICD-10-CM

## 2020-05-28 DIAGNOSIS — D1801 Hemangioma of skin and subcutaneous tissue: Secondary | ICD-10-CM

## 2020-05-28 DIAGNOSIS — L729 Follicular cyst of the skin and subcutaneous tissue, unspecified: Secondary | ICD-10-CM

## 2020-05-28 DIAGNOSIS — L738 Other specified follicular disorders: Secondary | ICD-10-CM

## 2020-05-28 DIAGNOSIS — L918 Other hypertrophic disorders of the skin: Secondary | ICD-10-CM

## 2020-05-28 DIAGNOSIS — Z1283 Encounter for screening for malignant neoplasm of skin: Secondary | ICD-10-CM

## 2020-06-04 ENCOUNTER — Other Ambulatory Visit: Payer: Self-pay

## 2020-06-04 ENCOUNTER — Ambulatory Visit (INDEPENDENT_AMBULATORY_CARE_PROVIDER_SITE_OTHER): Payer: Medicare PPO

## 2020-06-04 DIAGNOSIS — Z23 Encounter for immunization: Secondary | ICD-10-CM

## 2020-06-04 NOTE — Progress Notes (Signed)
Scott Neal 65 yr old male presents to office today for first pneumonia vaccine per Garret Reddish, MD. Administered PREVNAR 13 IM 0.5 mL right arm. Patient tolerated well.

## 2020-06-10 ENCOUNTER — Encounter: Payer: Self-pay | Admitting: Dermatology

## 2020-06-10 NOTE — Progress Notes (Signed)
   Follow-Up Visit   Subjective  Scott Neal is a 65 y.o. male who presents for the following: Annual Exam (No abnormal history per patient, left nose inner eye lesion nonspecific, also bump on his back unsure how long its been there).  General skin examination, check spots on face and back Location:  Duration:  Quality:  Associated Signs/Symptoms: Modifying Factors:  Severity:  Timing: Context:   Objective  Well appearing patient in no apparent distress; mood and affect are within normal limits. Objective  Right Superior Helix: Hornlike 3 mm pink crust  Objective  Mid Back: 2 cm noninflamed deep dermal to subcutaneous solitary nodule  Objective  Left Malar Cheek, Mid Forehead: 2 mm flesh-colored delled papules  Objective  Left Flank: Smooth 2 mm red papule  Objective  Neck - Anterior: Pedunculated flesh-colored 2 mm papule  Objective  Mid Back: Waist up skin examination.  No atypical pigmented lesions or nonmelanoma skin cancer.    All skin waist up examined.   Assessment & Plan    AK (actinic keratosis) Right Superior Helix  Destruction of lesion - Right Superior Helix Complexity: simple   Destruction method: cryotherapy   Informed consent: discussed and consent obtained   Timeout:  patient name, date of birth, surgical site, and procedure verified Lesion destroyed using liquid nitrogen: Yes   Cryotherapy cycles:  3 Outcome: patient tolerated procedure well with no complications   Post-procedure details: wound care instructions given    Cyst of skin Mid Back  Patient may choose to schedule 30-minute surgery to remove this in the future if he so chooses.  Sebaceous hyperplasia (2) Mid Forehead; Left Malar Cheek  Patient told of the resemblance to early basal cell skin cancer so if any similar lesions were to grow or bleed he would return for biopsy  Hemangioma of skin Left Flank  Intervention not currently necessary  Skin tag Neck -  Anterior  Patient may elect removal in the future.  Encounter for screening for malignant neoplasm of skin Mid Back  Annual skin examination, encouraged to self examine twice annually.  Continued ultraviolet protection.      I, Lavonna Monarch, MD, have reviewed all documentation for this visit.  The documentation on 06/10/20 for the exam, diagnosis, procedures, and orders are all accurate and complete.

## 2020-07-29 ENCOUNTER — Inpatient Hospital Stay: Admission: RE | Admit: 2020-07-29 | Payer: Self-pay | Source: Ambulatory Visit

## 2020-08-13 ENCOUNTER — Encounter: Payer: Self-pay | Admitting: Family Medicine

## 2020-08-13 ENCOUNTER — Other Ambulatory Visit: Payer: Self-pay

## 2020-08-13 ENCOUNTER — Ambulatory Visit (INDEPENDENT_AMBULATORY_CARE_PROVIDER_SITE_OTHER)
Admission: RE | Admit: 2020-08-13 | Discharge: 2020-08-13 | Disposition: A | Discharge status: Home / Self Care | Payer: Self-pay | Source: Ambulatory Visit | Attending: Family Medicine | Admitting: Family Medicine

## 2020-08-13 DIAGNOSIS — I700 Atherosclerosis of aorta: Secondary | ICD-10-CM | POA: Insufficient documentation

## 2020-08-13 DIAGNOSIS — E785 Hyperlipidemia, unspecified: Secondary | ICD-10-CM

## 2020-08-14 ENCOUNTER — Encounter: Payer: Self-pay | Admitting: Family Medicine

## 2020-08-14 DIAGNOSIS — I712 Thoracic aortic aneurysm, without rupture: Secondary | ICD-10-CM | POA: Insufficient documentation

## 2020-08-14 DIAGNOSIS — I7121 Aneurysm of the ascending aorta, without rupture: Secondary | ICD-10-CM | POA: Insufficient documentation

## 2020-08-15 ENCOUNTER — Other Ambulatory Visit: Payer: Self-pay

## 2020-08-15 ENCOUNTER — Telehealth: Payer: Self-pay

## 2020-08-15 ENCOUNTER — Encounter: Payer: Self-pay | Admitting: Family Medicine

## 2020-08-15 DIAGNOSIS — I2510 Atherosclerotic heart disease of native coronary artery without angina pectoris: Secondary | ICD-10-CM

## 2020-08-15 NOTE — Telephone Encounter (Signed)
Pt called back about medication and cardiology referral. He said that he is agreeable to both. He stated that a nurse can call him back with any questions. Please Advise

## 2020-08-16 ENCOUNTER — Other Ambulatory Visit: Payer: Self-pay

## 2020-08-16 MED ORDER — ROSUVASTATIN CALCIUM 20 MG PO TABS: 20.0000 mg | ORAL_TABLET | Freq: Every day | ORAL | 3 refills | Status: DC

## 2020-08-16 NOTE — Telephone Encounter (Signed)
Medication has been sent in to the pharmacy and referral has been placed to cardiology.

## 2020-08-22 ENCOUNTER — Ambulatory Visit (HOSPITAL_BASED_OUTPATIENT_CLINIC_OR_DEPARTMENT_OTHER): Payer: Medicare PPO | Admitting: Cardiovascular Disease

## 2020-08-22 ENCOUNTER — Encounter (HOSPITAL_BASED_OUTPATIENT_CLINIC_OR_DEPARTMENT_OTHER): Payer: Self-pay | Admitting: Cardiovascular Disease

## 2020-08-22 ENCOUNTER — Other Ambulatory Visit: Payer: Self-pay

## 2020-08-22 VITALS — BP 122/70 | HR 70 | Ht 67.0 in | Wt 216.6 lb

## 2020-08-22 DIAGNOSIS — I2510 Atherosclerotic heart disease of native coronary artery without angina pectoris: Secondary | ICD-10-CM | POA: Diagnosis not present

## 2020-08-22 DIAGNOSIS — E6609 Other obesity due to excess calories: Secondary | ICD-10-CM

## 2020-08-22 DIAGNOSIS — I712 Thoracic aortic aneurysm, without rupture: Secondary | ICD-10-CM

## 2020-08-22 DIAGNOSIS — Z01812 Encounter for preprocedural laboratory examination: Secondary | ICD-10-CM

## 2020-08-22 DIAGNOSIS — E785 Hyperlipidemia, unspecified: Secondary | ICD-10-CM

## 2020-08-22 DIAGNOSIS — Z01818 Encounter for other preprocedural examination: Secondary | ICD-10-CM

## 2020-08-22 DIAGNOSIS — I2584 Coronary atherosclerosis due to calcified coronary lesion: Secondary | ICD-10-CM

## 2020-08-22 DIAGNOSIS — Z6833 Body mass index (BMI) 33.0-33.9, adult: Secondary | ICD-10-CM

## 2020-08-22 DIAGNOSIS — I10 Essential (primary) hypertension: Secondary | ICD-10-CM

## 2020-08-22 DIAGNOSIS — I7121 Aneurysm of the ascending aorta, without rupture: Secondary | ICD-10-CM

## 2020-08-22 DIAGNOSIS — E669 Obesity, unspecified: Secondary | ICD-10-CM | POA: Insufficient documentation

## 2020-08-22 HISTORY — DX: Atherosclerotic heart disease of native coronary artery without angina pectoris: I25.10

## 2020-08-22 HISTORY — DX: Obesity, unspecified: E66.9

## 2020-08-22 MED ORDER — METOPROLOL TARTRATE 50 MG PO TABS: ORAL_TABLET | ORAL | 0 refills | Status: DC

## 2020-08-22 MED ORDER — METOPROLOL SUCCINATE ER 25 MG PO TB24: 25.0000 mg | ORAL_TABLET | Freq: Every day | ORAL | 1 refills | Status: DC

## 2020-08-22 MED ORDER — METOPROLOL SUCCINATE ER 25 MG PO TB24: ORAL_TABLET | ORAL | 1 refills | Status: DC

## 2020-08-22 NOTE — Assessment & Plan Note (Signed)
We discussed increasing his exercise to 150 minutes a week.  He is also going to work on his diet.  He is going to think about whether he has time to enroll in the PREP program through the [facility].

## 2020-08-22 NOTE — Assessment & Plan Note (Signed)
Continue rosuvastatin.  Check fasting lipids and CMP in 2 months.

## 2020-08-22 NOTE — Assessment & Plan Note (Signed)
Blood pressure is well-controlled in the office.  He notes that it has been elevated on the wrist cuff he uses at home but thinks it might not be accurate.  He plans to get a new cuff.  Continue losartan.  We will add metoprolol succinate 12.5 mg daily.

## 2020-08-22 NOTE — Patient Instructions (Signed)
Medication Instructions:  START ASPIRIN 81 MG DAILY   START METOPROLOL SUCC 25 MG 1/2 TABLET DAILY   TAKE METOPROLOL TARTRATE 50 MG ONLY THE MORNING OF YOUR CARDIAC CT , TAKE 2 HOURS PRIOR TO CT  *If you need a refill on your cardiac medications before your next appointment, please call your pharmacy*  Lab Work: BMET 1 WEEK PRIOR TO CARDIAC CT   FASTING LP/CMET IN [place]  If you have labs (blood work) drawn today and your tests are completely normal, you will receive your results only by: [place] (if you have MyChart) OR A paper copy in the mail If you have any lab test that is abnormal or we need to change your treatment, we will call you to review the results.   Testing/Procedures: Your physician has requested that you have cardiac CT. Cardiac computed tomography (CT) is a painless test that uses an x-ray machine to take clear, detailed pictures of your heart. For further information please visit HugeFiesta.tn. Please follow instruction sheet as given. ONCE INSURANCE HAS BEEN REVIEWED THE OFFICE WILL CALL YOU TO ARRANGE  IF YOU DO NOT HEAR FROM THE OFFICE IN 2 WEEKS PLEASE CALL TO FOLLOW UP    Follow-Up: At [facility], you and your health needs are our priority.  As part of our continuing mission to provide you with exceptional heart care, we have created designated Provider Care Teams.  These Care Teams include your primary Cardiologist (physician) and Advanced Practice Providers (APPs -  Physician Assistants and Nurse Practitioners) who all work together to provide you with the care you need, when you need it.  We recommend signing up for the patient portal called "MyChart".  Sign up information is provided on this After Visit Summary.  MyChart is used to connect with patients for Virtual Visits (Telemedicine).  Patients are able to view lab/test results, encounter notes, upcoming appointments, etc.  Non-urgent messages can be sent to your provider as well.   To  learn more about what you can do with MyChart, go to [email or web address].    Your next appointment:   2 month(s) AFTER FASTING LABS   The format for your next appointment:   In Person  Provider:   Skeet Latch, MD   Other Instructions    Your cardiac CT will be scheduled at one of the below locations:   [facility] [address] [place], [place] [address] [phone]  [place] [address] [place], [place] [address] [phone]  If scheduled at [facility], please arrive at the [facility] main entrance (entrance A) of [facility] 30 minutes prior to test start time. Proceed to the [facility] Radiology Department (first floor) to check-in and test prep.  If scheduled at [facility], please arrive 15 mins early for check-in and test prep.  Please follow these instructions carefully (unless otherwise directed):  Hold all erectile dysfunction medications at least 3 days (72 hrs) prior to test.  On the Night Before the Test: Be sure to Drink plenty of water. Do not consume any caffeinated/decaffeinated beverages or chocolate 12 hours prior to your test. Do not take any antihistamines 12 hours prior to your test. If the patient has contrast allergy: Patient will need a prescription for Prednisone and very clear instructions (as follows): Prednisone 50 mg - take 13 hours prior to test Take another Prednisone 50 mg 7 hours prior to test Take another  Prednisone 50 mg 1 hour prior to test Take Benadryl 50 mg 1 hour prior to test Patient must complete all four doses of above prophylactic medications. Patient will need a ride after test due to Benadryl.  On the Day of the Test: Drink plenty of water until 1 hour prior to the test. Do not eat any food 4 hours prior to the test. You may take your regular medications prior to the test.  Take metoprolol  (Lopressor) two hours prior to test. HOLD Furosemide/Hydrochlorothiazide morning of the test. FEMALES- please wear underwire-free bra if available, avoid dresses & tight clothing  After the Test: Drink plenty of water. After receiving IV contrast, you may experience a mild flushed feeling. This is normal. On occasion, you may experience a mild rash up to 24 hours after the test. This is not dangerous. If this occurs, you can take Benadryl 25 mg and increase your fluid intake. If you experience trouble breathing, this can be serious. If it is severe call 911 IMMEDIATELY. If it is mild, please call our office. If you take any of these medications: Glipizide/Metformin, Avandament, Glucavance, please do not take 48 hours after completing test unless otherwise instructed.  Please allow 2-4 weeks for scheduling of routine cardiac CTs. Some insurance companies require a pre-authorization which may delay scheduling of this test.   For non-scheduling related questions, please contact the cardiac imaging nurse navigator should you have any questions/concerns: Marchia Bond, Cardiac Imaging Nurse Navigator [name], Cardiac Imaging Nurse Navigator [facility] Direct Office Dial: [phone]   For scheduling needs, including cancellations and rescheduling, please call [place], [phone].

## 2020-08-22 NOTE — Assessment & Plan Note (Addendum)
Calcium score 1958, 96th percentile.  She does not have any symptoms but does note some exertional dyspnea that he attributes mostly to being out of shape and overweight.  We discussed stress testing versus coronary CTA.  Given that he is not really having any symptoms we will not proceed with a cardiac cath at this time.  We will get a coronary CT-A to assess for obstructive coronary disease.  He already started aspirin 81 mg and switch from simvastatin to rosuvastatin.  We will get lipids and a CMP in 2 months and he will follow-up after that.  Starting metoprolol as above.

## 2020-08-22 NOTE — Assessment & Plan Note (Signed)
Add metoprolol 12.5 mg as above.  He will need a repeat

## 2020-08-22 NOTE — Progress Notes (Signed)
Cardiology Office Note:    Date:  08/22/2020   ID:  Scott Neal, DOB [birth year], MRN [id number]  PCP:  Marin Olp, MD   [facility] Providers Cardiologist:  None     Referring MD: Marin Olp, MD   No chief complaint on file.   History of Present Illness:    Scott Neal is a 65 y.o. male with a hx of hyperlipidemia, hypertension, and sleep apnea (no CPAP), here for the initial evaluation of coronary calcification. He had a calcium score 08/2020 with a calcium score of 1958 which was 96th percentile. He also had a 4.3 cm ascending aortic aneurysm.   Today, he appears well. Sometimes he checks his blood pressure at home. He owns a wrist cuff, but believes it is inaccurate after a reading in the 180s. At clinic visits he has ranged in the 120s-130s. He recently travelled to  and was infected with COVID around 07/21/2020. About two weeks ago he tested negative, and is recovering well. Since having COVID he has lost about 6 pounds. He enjoys steaks, chicken, and fish. Fried foods are usually limited to fish, and once in a while he orders out. Typically he does not get a lot of formal exercise. However he does complete yard work, or organizing his garage or upper floor of the house. Also enjoys playing golf about twice a month, and notes this is likely his most strenuous activity. His stamina is lower than his baseline years ago, and usually takes a 15-20 minute nap mostly due to his eyes feeling tired. 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 While at the beach recently, he did not notice any significant exertional symptoms. He endorses some LE edema every now and then, but does not usually notice. For 20 years his regimen has included a statin, and he occasionally suffers from leg cramps. Last week he began taking aspirin and rosuvastatin regularly. In college he smoked cigarettes, but smokes cigars now. He will drink about 2 beers and 1  whiskey a day at most. At night he normally wakes up at least once, and endorses restless legs. Uncommonly, he is unable to fall asleep again for 1-2 hours. He denies any palpitations, chest pain, or shortness of breath. No lightheadedness, headaches, syncope, orthopnea, or PND.    Past Medical History:  Diagnosis Date   Allergy    Cataract    removed both eyes   Coronary artery calcification 08/22/2020   Hyperlipidemia    currently under ontrol    Hypertension    Obesity 08/22/2020   Sleep apnea    no cpap     Past Surgical History:  Procedure Laterality Date   APPENDECTOMY     cataract lens implants     bilateral   COLONOSCOPY     x2 last 2010 1 HPP    DENTAL SURGERY     SINUS IRRIGATION  2003    Current Medications: Current Meds  Medication Sig   aspirin EC 81 MG tablet Take 81 mg by mouth daily. Swallow whole.   COVID-19 mRNA vaccine, Moderna, 100 MCG/0.5ML injection Inject into the muscle.   losartan (COZAAR) 100 MG tablet TAKE ONE TABLET BY MOUTH DAILY   rosuvastatin (CRESTOR) 20 MG tablet Take 1 tablet (20 mg total) by mouth daily.   [DISCONTINUED] metoprolol succinate (TOPROL XL) 25 MG 24 hr tablet Take 1 tablet (25 mg total) by mouth daily.   [DISCONTINUED] metoprolol tartrate (LOPRESSOR) 50  MG tablet TAKE 1 TABLET MORNING OF CT  DO NOT TAKE METOPROLOL SUCC     Allergies:   Patient has no known allergies.   Social History   Socioeconomic History   Marital status: Married    Spouse name: Not on file   Number of children: Not on file   Years of education: Not on file   Highest education level: Not on file  Occupational History   Not on file  Tobacco Use   Smoking status: Some Days    Packs/day: 0.75    Years: 4.00    Pack years: 3.00    Types: Cigarettes, Cigars    Last attempt to quit: 03/11/1997    Years since quitting: 23.4   Smokeless tobacco: Never  Vaping Use   Vaping Use: Never used  Substance and Sexual Activity   Alcohol use: Yes     Alcohol/week: 10.0 standard drinks    Types: 10 Standard drinks or equivalent per week   Drug use: No   Sexual activity: Not on file  Other Topics Concern   Not on file  Social History Narrative   Married (wife patient outside practice) 3 children, 1 step child. 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       Hobbies: formerly played hockey-wants to get back in, golf, plays music   Social Determinants of Health   Financial Resource Strain: Low Risk    Difficulty of Paying Living Expenses: Not hard at all  Food Insecurity: No Food Insecurity   Worried About Charity fundraiser in the Last Year: Never true   Arboriculturist in the Last Year: Never true  Transportation Needs: No Transportation Needs   Lack of Transportation (Medical): No   Lack of Transportation (Non-Medical): No  Physical Activity: Insufficiently Active   Days of Exercise per Week: 1 day   Minutes of Exercise per Session: 30 min  Stress: No Stress Concern Present   Feeling of Stress : Only a little  Social Connections: Engineer, building services of Communication with Friends and Family: More than three times a week   Frequency of Social Gatherings with Friends and Family: More than three times a week   Attends Religious Services: More than 4 times per year   Active Member of [employer] or Organizations: Yes   Attends Music therapist: More than 4 times per year   Marital Status: Married     Family History: The patient's family history includes Breast cancer in his mother; Diabetes in his brother; GER disease in his mother; Heart attack in his father; Heart disease in his paternal grandmother; Hypertension in his brother, brother, and father; Nephrolithiasis in his daughter; Stroke in his mother. There is no history of Colon polyps, Colon cancer, Esophageal cancer, Rectal cancer, or Stomach cancer.  ROS:   Please see the history of present illness.    (+) Bilateral LE edema (+)  Bilateral LE muscle cramps All other systems reviewed and are negative.  EKGs/Labs/Other Studies Reviewed:    The following studies were reviewed today:  Coronary Calcium Score 08/13/2020: FINDINGS: Coronary arteries: Normal origins.   Coronary Calcium Score: Left main: 0   Left anterior descending artery: 1011   Left circumflex artery: 89   Right coronary artery: 859   Total: 1958   Percentile: 96   Pericardium: Normal.   Ascending Aorta: Dilated measuring 68m   [facility]  IMPRESSION: 1. Coronary calcium score of 1959. This was 96th percentile for age-, race-, and sex-matched controls. 2.  Dilated ascending aorta measuring 6m  EKG:   08/22/2020: Sinus rhythm. Rate 70 bpm.    Recent Labs: 05/01/2020: ALT 25; BUN 17; Creatinine, Ser 0.95; Hemoglobin 13.7; Platelets 206.0; Potassium 4.3; Sodium 138  Recent Lipid Panel    Component Value Date/Time   CHOL 145 05/01/2020 1128   TRIG 120.0 05/01/2020 1128   HDL 55.10 05/01/2020 1128   CHOLHDL 3 05/01/2020 1128   VLDL 24.0 05/01/2020 1128   LDLCALC 66 05/01/2020 1128   LDLCALC 79 10/18/2019 1205        Physical Exam:    Wt Readings from Last 3 Encounters:  08/22/20 216 lb 9.6 oz (98.2 kg)  05/09/20 220 lb (99.8 kg)  05/01/20 222 lb 6.4 oz (100.9 kg)    VS:  BP 122/70 (BP Location: Right Arm, Patient Position: Sitting)   Pulse 70   Ht '5\' 7"'$  (1.702 m)   Wt 216 lb 9.6 oz (98.2 kg)   BMI 33.92 kg/m  , BMI Body mass index is 33.92 kg/m. GENERAL:  Well appearing HEENT: Pupils equal round and reactive, fundi not visualized, oral mucosa unremarkable NECK:  No jugular venous distention, waveform within normal limits, carotid upstroke brisk and symmetric, no bruits LUNGS:  Clear to auscultation bilaterally HEART:  RRR.  PMI not displaced or sustained,S1 and S2 within normal limits, no S3, no S4, no clicks, no rubs, no murmurs ABD:  Flat, positive bowel sounds  normal in frequency in pitch, no bruits, no rebound, no guarding, no midline pulsatile mass, no hepatomegaly, no splenomegaly EXT:  2 plus pulses throughout, no edema, no cyanosis no clubbing SKIN:  No rashes no nodules NEURO:  Cranial nerves II through XII grossly intact, motor grossly intact throughout PSYCH:  Cognitively intact, oriented to person place and time   ASSESSMENT:    1. Pre-procedure lab exam   2. Coronary artery calcification   3. Essential hypertension   4. Ascending aortic aneurysm ([place]   5. Hyperlipidemia, unspecified hyperlipidemia type   6. Class 1 obesity due to excess calories with serious comorbidity and body mass index (BMI) of 33.0 to 33.9 in adult    PLAN:    Coronary artery calcification Calcium score 1958, 96th percentile.  She does not have any symptoms but does note some exertional dyspnea that he attributes mostly to being out of shape and overweight.  We discussed stress testing versus coronary CTA.  Given that he is not really having any symptoms we will not proceed with a cardiac cath at this time.  We will get a coronary CT-A to assess for obstructive coronary disease.  He already started aspirin 81 mg and switch from simvastatin to rosuvastatin.  We will get lipids and a CMP in 2 months and he will follow-up after that.  Starting metoprolol as above.  Essential hypertension Blood pressure is well-controlled in the office.  He notes that it has been elevated on the wrist cuff he uses at home but thinks it might not be accurate.  He plans to get a new cuff.  Continue losartan.  We will add metoprolol succinate 12.5 mg daily.  Ascending aortic aneurysm (HCC) Add metoprolol 12.5 mg as above.  He will need a repeat  Hyperlipidemia Continue rosuvastatin.  Check fasting lipids and CMP in 2 months.  Obesity We discussed increasing his exercise to 150 minutes a week.  He is also going  to work on his diet.  He is going to think about whether he has time to  enroll in the PREP program through the [facility]    Disposition: FU with Sevanna Ballengee C. Oval Linsey, MD, [facility] in 3 months.   Medication Adjustments/Labs and Tests Ordered: Current medicines are reviewed at length with the patient today.  Concerns regarding medicines are outlined above.   Orders Placed This Encounter  Procedures   CT CORONARY MORPH W/CTA COR W/SCORE W/CA W/CM &/OR WO/CM   Comprehensive metabolic panel   Lipid panel   Basic metabolic panel   EKG [id number]    Meds ordered this encounter  Medications   DISCONTD: metoprolol succinate (TOPROL XL) 25 MG 24 hr tablet    Sig: Take 1 tablet (25 mg total) by mouth daily.    Dispense:  90 tablet    Refill:  1   DISCONTD: metoprolol tartrate (LOPRESSOR) 50 MG tablet    Sig: TAKE 1 TABLET MORNING OF CT  DO NOT TAKE METOPROLOL SUCC    Dispense:  1 tablet    Refill:  0   metoprolol succinate (TOPROL XL) 25 MG 24 hr tablet    Sig: TAKE 1/2 TABLET DAILY    Dispense:  45 tablet    Refill:  1    D/C 25 MG ONE DAILY RX   DISCONTD: metoprolol tartrate (LOPRESSOR) 50 MG tablet    Sig: TAKE 1 TABLET MORNING OF CT  DO NOT TAKE METOPROLOL SUCC    Dispense:  1 tablet    Refill:  0   metoprolol tartrate (LOPRESSOR) 50 MG tablet    Sig: TAKE 1 TABLET MORNING OF CT, DO NOT TAKE METOPROLOL SUCC    Dispense:  1 tablet    Refill:  0    Patient Instructions  Medication Instructions:  START ASPIRIN 81 MG DAILY   START METOPROLOL SUCC 25 MG 1/2 TABLET DAILY   TAKE METOPROLOL TARTRATE 50 MG ONLY THE MORNING OF YOUR CARDIAC CT , TAKE 2 HOURS PRIOR TO CT  *If you need a refill on your cardiac medications before your next appointment, please call your pharmacy*  Lab Work: BMET 1 WEEK PRIOR TO CARDIAC CT   FASTING LP/CMET IN [place]  If you have labs (blood work) drawn today and your tests are completely normal, you will receive your results only by: [employer] (if you have MyChart) OR A paper copy in the mail If you have any lab  test that is abnormal or we need to change your treatment, we will call you to review the results.   Testing/Procedures: Your physician has requested that you have cardiac CT. Cardiac computed tomography (CT) is a painless test that uses an x-ray machine to take clear, detailed pictures of your heart. For further information please visit [email or web address].[email or web address]. Please follow instruction sheet as given. ONCE INSURANCE HAS BEEN REVIEWED THE OFFICE WILL CALL YOU TO ARRANGE  IF YOU DO NOT HEAR FROM THE OFFICE IN 2 WEEKS PLEASE CALL TO FOLLOW UP    Follow-Up: At [facility], you and your health needs are our priority.  As part of our continuing mission to provide you with exceptional heart care, we have created designated Provider Care Teams.  These Care Teams include your primary Cardiologist (physician) and Advanced Practice Providers (APPs -  Physician Assistants and Nurse Practitioners) who all work together to provide you with the care you need, when you need it.  We recommend signing up for the patient portal called "MyChart".  Sign up information is provided on this After Visit Summary.  MyChart is used to connect with patients for Virtual Visits (Telemedicine).  Patients are able to view lab/test results, encounter notes, upcoming appointments, etc.  Non-urgent messages can be sent to your provider as well.   To learn more about what you can do with MyChart, go to [email or web address].    Your next appointment:   2 month(s) AFTER FASTING LABS   The format for your next appointment:   In Person  Provider:   Skeet Latch, MD   Other Instructions    Your cardiac CT will be scheduled at one of the below locations:   [facility] [address], [place] [address] [phone]  [facility] [address] [place], [place] [address] [phone]  If scheduled at [facility], please arrive at the  [facility] main entrance (entrance A) of [facility] 30 minutes prior to test start time. Proceed to the [facility] Radiology Department (first floor) to check-in and test prep.  If scheduled at [facility], please arrive 15 mins early for check-in and test prep.  Please follow these instructions carefully (unless otherwise directed):  Hold all erectile dysfunction medications at least 3 days (72 hrs) prior to test.  On the Night Before the Test: Be sure to Drink plenty of water. Do not consume any caffeinated/decaffeinated beverages or chocolate 12 hours prior to your test. Do not take any antihistamines 12 hours prior to your test. If the patient has contrast allergy: Patient will need a prescription for Prednisone and very clear instructions (as follows): Prednisone 50 mg - take 13 hours prior to test Take another Prednisone 50 mg 7 hours prior to test Take another Prednisone 50 mg 1 hour prior to test Take Benadryl 50 mg 1 hour prior to test Patient must complete all four doses of above prophylactic medications. Patient will need a ride after test due to Benadryl.  On the Day of the Test: Drink plenty of water until 1 hour prior to the test. Do not eat any food 4 hours prior to the test. You may take your regular medications prior to the test.  Take metoprolol (Lopressor) two hours prior to test. HOLD Furosemide/Hydrochlorothiazide morning of the test. FEMALES- please wear underwire-free bra if available, avoid dresses & tight clothing  After the Test: Drink plenty of water. After receiving IV contrast, you may experience a mild flushed feeling. This is normal. On occasion, you may experience a mild rash up to 24 hours after the test. This is not dangerous. If this occurs, you can take Benadryl 25 mg and increase your fluid intake. If you experience trouble breathing, this can be serious. If it is severe call 911 IMMEDIATELY. If it is mild,  please call our office. If you take any of these medications: Glipizide/Metformin, Avandament, Glucavance, please do not take 48 hours after completing test unless otherwise instructed.  Please allow 2-4 weeks for scheduling of routine cardiac CTs. Some insurance companies require a pre-authorization which may delay scheduling of this test.   For non-scheduling related questions, please contact the cardiac imaging nurse navigator should you have any questions/concerns: Marchia Bond, Cardiac Imaging Nurse Navigator [name], Cardiac Imaging Nurse Navigator [facility] Direct Office Dial: [phone]   For scheduling needs, including cancellations and rescheduling, please call [place], [phone].   I,Mathew Stumpf,acting as a Education administrator for [employer]  Oval Linsey, MD.,have documented all relevant documentation on the behalf of Skeet Latch, MD,as directed by  Skeet Latch, MD while in the presence of Skeet Latch, MD.  I, [place] Oval Linsey, MD have reviewed all documentation for this visit.  The documentation of the exam, diagnosis, procedures, and orders on 08/22/2020 are all accurate and complete.   Signed, Skeet Latch, MD  08/22/2020 6:14 PM    [facility]

## 2020-08-30 ENCOUNTER — Telehealth (HOSPITAL_COMMUNITY): Payer: Self-pay | Admitting: *Deleted

## 2020-08-30 DIAGNOSIS — I10 Essential (primary) hypertension: Secondary | ICD-10-CM | POA: Diagnosis not present

## 2020-08-30 DIAGNOSIS — Z01812 Encounter for preprocedural laboratory examination: Secondary | ICD-10-CM | POA: Diagnosis not present

## 2020-08-30 LAB — BASIC METABOLIC PANEL
BUN/Creatinine Ratio: 13 (ref 10–24)
BUN: 12 mg/dL (ref 8–27)
CO2: 23 mmol/L (ref 20–29)
Calcium: 9.4 mg/dL (ref 8.6–10.2)
Chloride: 103 mmol/L (ref 96–106)
Creatinine, Ser: 0.92 mg/dL (ref 0.76–1.27)
Glucose: 96 mg/dL (ref 65–99)
Potassium: 5.1 mmol/L (ref 3.5–5.2)
Sodium: 141 mmol/L (ref 134–144)
eGFR: 92 mL/min/{1.73_m2} (ref >59)

## 2020-08-30 NOTE — Telephone Encounter (Signed)
Attempted to call patient regarding upcoming cardiac CT appointment on cell number but unable to leave a message.  Home phone number attempted and was able to leave a voicemail with name and call back number.  [name] RN Navigator Cardiac Imaging [facility] [phone] Office [phone] Cell

## 2020-09-03 ENCOUNTER — Telehealth (HOSPITAL_COMMUNITY): Payer: Self-pay | Admitting: *Deleted

## 2020-09-03 NOTE — Telephone Encounter (Signed)
Reaching out to patient to offer assistance regarding upcoming cardiac imaging study; pt verbalizes understanding of appt date/time, parking situation and where to check in, pre-test NPO status and medications ordered; name and call back number provided for further questions should they arise  Scott Clement RN Navigator Cardiac Imaging [facility] [phone] office [phone] cell  Patient to take '50mg'$  metoprolol tartrate two hours prior to cardiac CT. Patient to hold his daily metoprolol succinate.

## 2020-09-04 ENCOUNTER — Ambulatory Visit (HOSPITAL_COMMUNITY)
Admission: RE | Admit: 2020-09-04 | Discharge: 2020-09-04 | Disposition: A | Discharge status: Home / Self Care | Payer: Medicare PPO | Source: Ambulatory Visit | Attending: Cardiovascular Disease | Admitting: Cardiovascular Disease

## 2020-09-04 ENCOUNTER — Other Ambulatory Visit (HOSPITAL_COMMUNITY): Payer: Self-pay | Admitting: Emergency Medicine

## 2020-09-04 ENCOUNTER — Ambulatory Visit (HOSPITAL_COMMUNITY)
Admission: RE | Admit: 2020-09-04 | Discharge: 2020-09-04 | Disposition: A | Discharge status: Home / Self Care | Payer: Medicare PPO | Source: Ambulatory Visit | Attending: Cardiology | Admitting: Cardiology

## 2020-09-04 ENCOUNTER — Other Ambulatory Visit: Payer: Self-pay

## 2020-09-04 DIAGNOSIS — R079 Chest pain, unspecified: Secondary | ICD-10-CM

## 2020-09-04 DIAGNOSIS — I10 Essential (primary) hypertension: Secondary | ICD-10-CM | POA: Diagnosis not present

## 2020-09-04 DIAGNOSIS — I2584 Coronary atherosclerosis due to calcified coronary lesion: Secondary | ICD-10-CM | POA: Insufficient documentation

## 2020-09-04 DIAGNOSIS — R931 Abnormal findings on diagnostic imaging of heart and coronary circulation: Secondary | ICD-10-CM

## 2020-09-04 DIAGNOSIS — I2510 Atherosclerotic heart disease of native coronary artery without angina pectoris: Secondary | ICD-10-CM

## 2020-09-04 DIAGNOSIS — E785 Hyperlipidemia, unspecified: Secondary | ICD-10-CM | POA: Diagnosis not present

## 2020-09-04 DIAGNOSIS — I700 Atherosclerosis of aorta: Secondary | ICD-10-CM | POA: Diagnosis not present

## 2020-09-04 MED ORDER — NITROGLYCERIN 0.4 MG SL SUBL
SUBLINGUAL_TABLET | SUBLINGUAL | Status: AC
  Filled 2020-09-04: qty 2

## 2020-09-04 MED ORDER — IOHEXOL 350 MG/ML SOLN
80.0000 mL | Freq: Once | INTRAVENOUS | Status: AC | PRN
  Administered 2020-09-04: 80 mL via INTRAVENOUS

## 2020-09-04 MED ORDER — NITROGLYCERIN 0.4 MG SL SUBL
0.8000 mg | SUBLINGUAL_TABLET | Freq: Once | SUBLINGUAL | Status: AC
  Administered 2020-09-04: 0.8 mg via SUBLINGUAL

## 2020-09-26 DIAGNOSIS — H52203 Unspecified astigmatism, bilateral: Secondary | ICD-10-CM | POA: Diagnosis not present

## 2020-09-26 DIAGNOSIS — H26492 Other secondary cataract, left eye: Secondary | ICD-10-CM | POA: Diagnosis not present

## 2020-10-01 ENCOUNTER — Other Ambulatory Visit: Payer: Self-pay

## 2020-10-01 ENCOUNTER — Ambulatory Visit (INDEPENDENT_AMBULATORY_CARE_PROVIDER_SITE_OTHER): Payer: Medicare PPO

## 2020-10-01 DIAGNOSIS — Z23 Encounter for immunization: Secondary | ICD-10-CM

## 2020-10-21 ENCOUNTER — Ambulatory Visit: Payer: Medicare PPO

## 2020-10-23 NOTE — Progress Notes (Signed)
Phone [phone] In person visit   Subjective:   Scott Neal is a 65 y.o. year old very pleasant male patient who presents for/with See problem oriented charting Chief Complaint  Patient presents with   Follow-up   Trouble sleeping    This visit occurred during the SARS-CoV-2 public health emergency.  Safety protocols were in place, including screening questions prior to the visit, additional usage of staff PPE, and extensive cleaning of exam room while observing appropriate contact time as indicated for disinfecting solutions.   Past Medical History-  Patient Active Problem List   Diagnosis Date Noted   Ascending aortic aneurysm 08/14/2020    Priority: 1.   Aortic atherosclerosis ([place]) 08/13/2020    Priority: 2.   Hyperglycemia 02/04/2015    Priority: 2.   Essential hypertension 03/25/2007    Priority: 2.   Hyperlipidemia 07/29/2006    Priority: 2.   Former smoker 03/06/2014    Priority: 3.   Insomnia 03/06/2014    Priority: 3.   Obstructive sleep apnea 05/28/2012    Priority: 3.   Seasonal and perennial allergic rhinitis 06/21/2009    Priority: 3.   Coronary artery calcification 08/22/2020   Obesity 08/22/2020   Meniere disease, bilateral 07/24/2017    Medications- reviewed and updated Current Outpatient Medications  Medication Sig Dispense Refill   aspirin EC 81 MG tablet Take 81 mg by mouth daily. Swallow whole.     losartan (COZAAR) 100 MG tablet TAKE ONE TABLET BY MOUTH DAILY 90 tablet 3   metoprolol succinate (TOPROL XL) 25 MG 24 hr tablet TAKE 1/2 TABLET DAILY 45 tablet 1   rosuvastatin (CRESTOR) 20 MG tablet Take 1 tablet (20 mg total) by mouth daily. 90 tablet 3   No current facility-administered medications for this visit.     Objective:  BP 118/78   Pulse 68   Temp 97.8 F (36.6 C)   Ht 5' 7.01" (1.702 m)   Wt 214 lb 9.6 oz (97.3 kg)   SpO2 96%   BMI 33.60 kg/m  Gen: NAD, resting comfortably CV: RRR no murmurs rubs or gallops Lungs:  CTAB no crackles, wheeze, rhonchi Abdomen: soft/nontender/nondistended/normal bowel sounds.  Ext: no edema Skin: warm, dry    Assessment and Plan   #social update- wife just had MV replacement at [facility] 2 weeks ago- recovering- had mechanical so on coumadin  #Left facial pain - cleared after antibiotics for sinusitis  # Insomnia/OSA S:-Patient does have OSA-unfortunately not able to tolerate CPAP at all.   -toes to bed 10 or 11 gets 4-5 hours then sometimes can get back to sleep and sometimes not. Will try to listen to [place] to quiet his mind. Prefers not to some sleep medicine. Not at point wants to do meds. Does take CBD Gummies which helps some  A/P: discussed possible repeat sleep study/newer devices- he was never offered nasal cpap- could refer back to pulm- he will reach out if decides to do so   #hypertension S: compliant with losartan 100 mg daily.  toprol xl 25 mg.  Home readings #s: Does not check blood pressure regularly at home BP Readings from Last 3 Encounters:  10/30/20 118/78  09/04/20 115/75  08/22/20 122/70  A/P: Controlled. Continue current medications.   #hyperlipidemia #CAD-coronary arteries calcium score near 2000. No cp or sob #tobacco use- still doing cigars- recommend full cessation S: Medication:Crestor 20 mg daily-previously on simvastatin 40 mg.  We did coronary artery calcium score due to father with  heart attack in 50s but he was a heavy smoker. Has follow up tomorrow- had ct coronary morphology Lab Results  Component Value Date   CHOL 145 05/01/2020   HDL 55.10 05/01/2020   LDLCALC 66 05/01/2020   TRIG 120.0 05/01/2020   CHOLHDL 3 05/01/2020   A/P: CAD is asymptomatic-has close follow-up with Dr. Oval Linsey.  For now we will continue current medication including Crestor and aspirin-update lipid panel today with LDL goal at least under 70 but honestly under 55 would be more ideal. Recommended quitting smoking  #Ascending aortic aneurysm-noted at 42 mm  on CT-plan for annual follow-up.  Discussed ideally keep systolic blood pressure between 105 and 120-he is within that range today.  Continue current medication and cardiology follow up. Emergent precautions given  # Hyperglycemia/insulin resistance/prediabetes S:  Medication: none Exercise and diet- doing some walking at [facility] but plans to do at home as well. Trying to eat reasonably healthy- down 6 lbs from last visit- watching calorie intake Lab Results  Component Value Date   HGBA1C 5.9 05/01/2020   HGBA1C 5.6 10/18/2019   HGBA1C 4.9 02/16/2019   A/P: hopefully stable or improved- update a1c today.   Health Maintenance Due  Topic Date Due   COVID-19 Vaccine -plans on bivalent booster- had covid in July has to wait at least 3 months. Maybe month before christmas 09/17/2020   Recommended follow up: No follow-ups on file. Future Appointments  Date Time Provider [place]  10/31/2020  4:20 PM Skeet Latch, MD [facility]  06/10/2021  9:30 AM Lavonna Monarch, MD [facility]    Lab/Order associations:   ICD-10-CM   1. Essential hypertension  I10 Comprehensive metabolic panel    Lipid panel    2. Hyperlipidemia, unspecified hyperlipidemia type  E78.5 Comprehensive metabolic panel    Lipid panel    3. Hyperglycemia  R73.9 Hemoglobin A1c      No orders of the defined types were placed in this encounter.  I,Jada Bradford,acting as a scribe for Garret Reddish, MD.,have documented all relevant documentation on the behalf of Garret Reddish, MD,as directed by  Garret Reddish, MD while in the presence of Garret Reddish, MD.  I, Garret Reddish, MD, have reviewed all documentation for this visit. The documentation on 10/30/20 for the exam, diagnosis, procedures, and orders are all accurate and complete.  Return precautions advised.  Garret Reddish, MD

## 2020-10-30 ENCOUNTER — Other Ambulatory Visit: Payer: Self-pay

## 2020-10-30 ENCOUNTER — Encounter: Payer: Self-pay | Admitting: Family Medicine

## 2020-10-30 ENCOUNTER — Ambulatory Visit: Payer: Medicare PPO | Admitting: Family Medicine

## 2020-10-30 VITALS — BP 118/78 | HR 68 | Temp 97.8°F | Ht 67.01 in | Wt 214.6 lb

## 2020-10-30 DIAGNOSIS — I2510 Atherosclerotic heart disease of native coronary artery without angina pectoris: Secondary | ICD-10-CM | POA: Diagnosis not present

## 2020-10-30 DIAGNOSIS — R7303 Prediabetes: Secondary | ICD-10-CM

## 2020-10-30 DIAGNOSIS — Z720 Tobacco use: Secondary | ICD-10-CM | POA: Diagnosis not present

## 2020-10-30 DIAGNOSIS — G4733 Obstructive sleep apnea (adult) (pediatric): Secondary | ICD-10-CM | POA: Diagnosis not present

## 2020-10-30 DIAGNOSIS — R739 Hyperglycemia, unspecified: Secondary | ICD-10-CM | POA: Diagnosis not present

## 2020-10-30 DIAGNOSIS — I10 Essential (primary) hypertension: Secondary | ICD-10-CM

## 2020-10-30 DIAGNOSIS — E785 Hyperlipidemia, unspecified: Secondary | ICD-10-CM | POA: Diagnosis not present

## 2020-10-30 DIAGNOSIS — I7121 Aneurysm of the ascending aorta, without rupture: Secondary | ICD-10-CM | POA: Diagnosis not present

## 2020-10-30 DIAGNOSIS — G4700 Insomnia, unspecified: Secondary | ICD-10-CM | POA: Diagnosis not present

## 2020-10-30 LAB — LIPID PANEL
Cholesterol: 134 mg/dL (ref 0–200)
HDL: 54.5 mg/dL (ref >39.00)
LDL Cholesterol: 68 mg/dL (ref 0–99)
NonHDL: 79.87
Total CHOL/HDL Ratio: 2
Triglycerides: 61 mg/dL (ref 0.0–149.0)
VLDL: 12.2 mg/dL (ref 0.0–40.0)

## 2020-10-30 LAB — COMPREHENSIVE METABOLIC PANEL
ALT: 18 U/L (ref 0–53)
AST: 17 U/L (ref 0–37)
Albumin: 4.4 g/dL (ref 3.5–5.2)
Alkaline Phosphatase: 42 U/L (ref 39–117)
BUN: 14 mg/dL (ref 6–23)
CO2: 29 mEq/L (ref 19–32)
Calcium: 9.5 mg/dL (ref 8.4–10.5)
Chloride: 107 mEq/L (ref 96–112)
Creatinine, Ser: 0.91 mg/dL (ref 0.40–1.50)
GFR: 87.64 mL/min (ref >60.00)
Glucose, Bld: 113 mg/dL — ABNORMAL HIGH (ref 70–99)
Potassium: 5.3 mEq/L — ABNORMAL HIGH (ref 3.5–5.1)
Sodium: 142 mEq/L (ref 135–145)
Total Bilirubin: 0.7 mg/dL (ref 0.2–1.2)
Total Protein: 6.9 g/dL (ref 6.0–8.3)

## 2020-10-30 LAB — HEMOGLOBIN A1C: Hgb A1c MFr Bld: 5.8 % (ref 4.6–6.5)

## 2020-10-30 NOTE — Patient Instructions (Addendum)
I recommend full cessation from cigars!  Please stop by lab before you go If you have mychart- we will send your results within 3 business days of [place] receiving them.  If you do not have mychart- we will call you about results within 5 business days of [place] receiving them.  *please also note that you will see labs on mychart as soon as they post. I will later go in and write notes on them- will say "notes from Dr. Yong Channel"  Recommended follow up: Return in about 6 months (around 04/30/2021) for physical or sooner if needed.

## 2020-10-31 ENCOUNTER — Ambulatory Visit (HOSPITAL_BASED_OUTPATIENT_CLINIC_OR_DEPARTMENT_OTHER): Payer: Medicare PPO | Admitting: Cardiovascular Disease

## 2020-10-31 VITALS — BP 128/76 | HR 73 | Ht 67.0 in | Wt 217.3 lb

## 2020-10-31 DIAGNOSIS — I2510 Atherosclerotic heart disease of native coronary artery without angina pectoris: Secondary | ICD-10-CM

## 2020-10-31 DIAGNOSIS — E669 Obesity, unspecified: Secondary | ICD-10-CM | POA: Diagnosis not present

## 2020-10-31 DIAGNOSIS — G4733 Obstructive sleep apnea (adult) (pediatric): Secondary | ICD-10-CM

## 2020-10-31 DIAGNOSIS — I7121 Aneurysm of the ascending aorta, without rupture: Secondary | ICD-10-CM | POA: Diagnosis not present

## 2020-10-31 DIAGNOSIS — Z6833 Body mass index (BMI) 33.0-33.9, adult: Secondary | ICD-10-CM

## 2020-10-31 DIAGNOSIS — E6609 Other obesity due to excess calories: Secondary | ICD-10-CM

## 2020-10-31 DIAGNOSIS — I10 Essential (primary) hypertension: Secondary | ICD-10-CM

## 2020-10-31 DIAGNOSIS — I2584 Coronary atherosclerosis due to calcified coronary lesion: Secondary | ICD-10-CM

## 2020-10-31 NOTE — Assessment & Plan Note (Signed)
Continue CPAP.  

## 2020-10-31 NOTE — Assessment & Plan Note (Signed)
4.4 cm on CT.  We will get an echo in 6 months. BP is stable and he is already on a beta blocker.

## 2020-10-31 NOTE — Assessment & Plan Note (Signed)
BP well-controlled on losartan and metoprolol.

## 2020-10-31 NOTE — Assessment & Plan Note (Signed)
Increase exercise to at least 150 minutes weekly.

## 2020-10-31 NOTE — Progress Notes (Signed)
Cardiology Office Note:    Date:  11/20/2020   ID:  Scott Neal, DOB 1955-05-06, MRN [id number]  PCP:  Marin Olp, MD   [facility] Providers Cardiologist:  None     Referring MD: Marin Olp, MD   No chief complaint on file.   History of Present Illness:    Scott Neal is a 65 y.o. male with a hx of ascending aorta aneurysm, hyperlipidemia, hypertension, and sleep apnea (not on CPAP), here for follow up. He was initially seen 08/22/2020 for the evaluation of coronary calcification. He had a calcium score 08/2020 with a calcium score of 1958 which was 96th percentile. He also had a 4.3 cm ascending aortic aneurysm.  Sometimes he checks his blood pressure at home. He owns a wrist cuff, but believes it is inaccurate after a reading in the 180s. At clinic visits he has ranged in the 120s-130s. He recently travelled to [place] and was infected with COVID around 07/21/2020. He endorses some LE edema every now and then, but does not usually notice. For 20 years his regimen has included a statin, and he occasionally suffers from leg cramps.  He was referred for a coronary CTA 08/2020, which revealed a 4.4 cm ascending aortic aneurysm. He had multivessel PAD, but no severe obstruction based on CT SFR. Today he says that he has been doing okay, but also says that he has been stressed lately. His wife recently underwent a mitral valve replacement. At home, he says that he has not been regularly checking his BP, but this morning his BP was 118/70. For exercise he has been walking at [facility]. Generally he has not been sleeping as well as he would like. At night, he usually has 4-5 hours of quality sleep, but then wakes up and may or may not fall back asleep. He has been experimenting with combinations of OTC sleep aids with little success. He denies any palpitations, chest pain, or shortness of breath. No lightheadedness, headaches, syncope, orthopnea, PND, lower extremity edema or exertional  symptoms.    Past Medical History:  Diagnosis Date   Allergy    Cataract    removed both eyes   Coronary artery calcification 08/22/2020   Hyperlipidemia    currently under ontrol    Hypertension    Obesity 08/22/2020   Sleep apnea    no cpap     Past Surgical History:  Procedure Laterality Date   APPENDECTOMY     cataract lens implants     bilateral   COLONOSCOPY     x2 last 2010 1 HPP    DENTAL SURGERY     SINUS IRRIGATION  2003    Current Medications: Current Meds  Medication Sig   aspirin EC 81 MG tablet Take 81 mg by mouth daily. Swallow whole.   losartan (COZAAR) 100 MG tablet TAKE ONE TABLET BY MOUTH DAILY   metoprolol succinate (TOPROL XL) 25 MG 24 hr tablet TAKE 1/2 TABLET DAILY   rosuvastatin (CRESTOR) 20 MG tablet Take 1 tablet (20 mg total) by mouth daily.     Allergies:   Patient has no known allergies.   Social History   Socioeconomic History   Marital status: Married    Spouse name: Not on file   Number of children: Not on file   Years of education: Not on file   Highest education level: Not on file  Occupational History   Not on file  Tobacco Use   Smoking status:  Some Days    Packs/day: 0.75    Years: 4.00    Pack years: 3.00    Types: Cigarettes, Cigars    Last attempt to quit: 03/11/1997    Years since quitting: 23.7   Smokeless tobacco: Never  Vaping Use   Vaping Use: Never used  Substance and Sexual Activity   Alcohol use: Yes    Alcohol/week: 10.0 standard drinks    Types: 10 Standard drinks or equivalent per week   Drug use: No   Sexual activity: Not on file  Other Topics Concern   Not on file  Social History Narrative   Married (wife patient outside practice) 3 children, 1 step child. 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       Hobbies: formerly played hockey-wants to get back in, golf, plays music   Social Determinants of Health   Financial Resource Strain: Low Risk    Difficulty of Paying Living  Expenses: Not hard at all  Food Insecurity: No Food Insecurity   Worried About Charity fundraiser in the Last Year: Never true   Arboriculturist in the Last Year: Never true  Transportation Needs: No Transportation Needs   Lack of Transportation (Medical): No   Lack of Transportation (Non-Medical): No  Physical Activity: Not on file  Stress: Not on file  Social Connections: Not on file     Family History: The patient's family history includes Breast cancer in his mother; Diabetes in his brother; GER disease in his mother; Heart attack in his father; Heart disease in his paternal grandmother; Hypertension in his brother, brother, and father; Nephrolithiasis in his daughter; Stroke in his mother. There is no history of Colon polyps, Colon cancer, Esophageal cancer, Rectal cancer, or Stomach cancer.  ROS:   Please see the history of present illness.    (+) Stress (+) Insomnia All other systems reviewed and are negative.  EKGs/Labs/Other Studies Reviewed:    The following studies were reviewed today:  Coronary Calcium Score 08/13/2020: FINDINGS: Coronary arteries: Normal origins.   Coronary Calcium Score: Left main: 0   Left anterior descending artery: 1011   Left circumflex artery: 89   Right coronary artery: 859   Total: 1958   Percentile: 96   Pericardium: Normal.   Ascending Aorta: Dilated measuring 45mm   Non-cardiac: See separate report from [facility].   IMPRESSION: 1. Coronary calcium score of 1958. This was 96th percentile for age-, race-, and sex-matched controls. 2.  Dilated ascending aorta measuring 59mm  EKG:   10/31/2020: EKG was not ordered today. 08/22/2020: Sinus rhythm. Rate 70 bpm.    Recent Labs: 05/01/2020: Hemoglobin 13.7; Platelets 206.0 10/30/2020: ALT 18 11/05/2020: BUN 14; Creatinine, Ser 0.88; Potassium 4.8; Sodium 140  Recent Lipid Panel    Component Value Date/Time   CHOL 134 10/30/2020 0930   TRIG 61.0 10/30/2020 0930    HDL 54.50 10/30/2020 0930   CHOLHDL 2 10/30/2020 0930   VLDL 12.2 10/30/2020 0930   LDLCALC 68 10/30/2020 0930   LDLCALC 79 10/18/2019 1205        Physical Exam:    Wt Readings from Last 3 Encounters:  10/31/20 217 lb 4.8 oz (98.6 kg)  10/30/20 214 lb 9.6 oz (97.3 kg)  08/22/20 216 lb 9.6 oz (98.2 kg)    VS:  BP 128/76 (BP Location: Left Arm, Patient Position: Sitting, Cuff Size: Large)   Pulse 73   Ht 5\' 7"  (1.702  m)   Wt 217 lb 4.8 oz (98.6 kg)   SpO2 96%   BMI 34.03 kg/m  , BMI Body mass index is 34.03 kg/m. GENERAL:  Well appearing HEENT: Pupils equal round and reactive, fundi not visualized, oral mucosa unremarkable NECK:  No jugular venous distention, waveform within normal limits, carotid upstroke brisk and symmetric, no bruits LUNGS:  Clear to auscultation bilaterally HEART:  RRR.  PMI not displaced or sustained,S1 and S2 within normal limits, no S3, no S4, no clicks, no rubs, no murmurs ABD:  Flat, positive bowel sounds normal in frequency in pitch, no bruits, no rebound, no guarding, no midline pulsatile mass, no hepatomegaly, no splenomegaly EXT:  2 plus pulses throughout, no edema, no cyanosis no clubbing SKIN:  No rashes no nodules NEURO:  Cranial nerves II through XII grossly intact, motor grossly intact throughout PSYCH:  Cognitively intact, oriented to person place and time   ASSESSMENT:    1. Aneurysm of ascending aorta without rupture   2. Coronary artery calcification   3. Essential hypertension   4. Class 1 obesity due to excess calories with serious comorbidity and body mass index (BMI) of 33.0 to 33.9 in adult   5. Obstructive sleep apnea     PLAN:    Ascending aortic aneurysm 4.4 cm on CT.  We will get an echo in 6 months. BP is stable and he is already on a beta blocker.   Coronary artery calcification CAD noted on calcium score.  He had a coronary CT-A with diffuse, moderate non-obstructive CAD.  He is asymptomatic.  Continue with diet,  exercise, aspirin, statin and beta blocker.  He will try to work on reducing work stress and exercising at least 150 minutes weekly.  Essential hypertension BP well-controlled on losartan and metoprolol.  Obesity Increase exercise to at least 150 minutes weekly.  Obstructive sleep apnea Continue CPAP.    Disposition: FU with Katrina Daddona C. Oval Linsey, MD, [facility] in 6 months.   Medication Adjustments/Labs and Tests Ordered: Current medicines are reviewed at length with the patient today.  Concerns regarding medicines are outlined above.   Orders Placed This Encounter  Procedures   ECHOCARDIOGRAM COMPLETE     No orders of the defined types were placed in this encounter.   Patient Instructions  Medication Instructions:  Your physician recommends that you continue on your current medications as directed. Please refer to the Current Medication list given to you today.   *If you need a refill on your cardiac medications before your next appointment, please call your pharmacy*  Lab Work: NONE   Testing/Procedures: Your physician has requested that you have an echocardiogram. Echocardiography is a painless test that uses sound waves to create images of your heart. It provides your doctor with information about the size and shape of your heart and how well your heart's chambers and valves are working. This procedure takes approximately one hour. There are no restrictions for this procedure. PRIOR TO FOLLOW UP   Follow-Up: At [facility], you and your health needs are our priority.  As part of our continuing mission to provide you with exceptional heart care, we have created designated Provider Care Teams.  These Care Teams include your primary Cardiologist (physician) and Advanced Practice Providers (APPs -  Physician Assistants and Nurse Practitioners) who all work together to provide you with the care you need, when you need it.  We recommend signing up for the patient portal called  "MyChart".  Sign up information is  provided on this After Visit Summary.  MyChart is used to connect with patients for Virtual Visits (Telemedicine).  Patients are able to view lab/test results, encounter notes, upcoming appointments, etc.  Non-urgent messages can be sent to your provider as well.   To learn more about what you can do with MyChart, go to [email or web address].    Your next appointment:   6 month(s) AFTER ECHO COMPLETED   The format for your next appointment:   In Person  Provider:   Skeet Latch, MD   Other Instructions   TRY TO EXERCISE [address]    I,Mathew Stumpf,acting as a scribe for Skeet Latch, MD.,have documented all relevant documentation on the behalf of Skeet Latch, MD,as directed by  Skeet Latch, MD while in the presence of Skeet Latch, MD.  I, [place] Oval Linsey, MD have reviewed all documentation for this visit.  The documentation of the exam, diagnosis, procedures, and orders on 11/20/2020 are all accurate and complete.   Signed, Skeet Latch, MD  11/20/2020 1:02 PM    [place]

## 2020-10-31 NOTE — Assessment & Plan Note (Signed)
CAD noted on calcium score.  He had a coronary CT-A with diffuse, moderate non-obstructive CAD.  He is asymptomatic.  Continue with diet, exercise, aspirin, statin and beta blocker.  He will try to work on reducing work stress and exercising at least 150 minutes weekly.

## 2020-10-31 NOTE — Patient Instructions (Addendum)
Medication Instructions:  Your physician recommends that you continue on your current medications as directed. Please refer to the Current Medication list given to you today.   *If you need a refill on your cardiac medications before your next appointment, please call your pharmacy*  Lab Work: NONE   Testing/Procedures: Your physician has requested that you have an echocardiogram. Echocardiography is a painless test that uses sound waves to create images of your heart. It provides your doctor with information about the size and shape of your heart and how well your heart's chambers and valves are working. This procedure takes approximately one hour. There are no restrictions for this procedure. PRIOR TO FOLLOW UP   Follow-Up: At [facility], you and your health needs are our priority.  As part of our continuing mission to provide you with exceptional heart care, we have created designated Provider Care Teams.  These Care Teams include your primary Cardiologist (physician) and Advanced Practice Providers (APPs -  Physician Assistants and Nurse Practitioners) who all work together to provide you with the care you need, when you need it.  We recommend signing up for the patient portal called "MyChart".  Sign up information is provided on this After Visit Summary.  MyChart is used to connect with patients for Virtual Visits (Telemedicine).  Patients are able to view lab/test results, encounter notes, upcoming appointments, etc.  Non-urgent messages can be sent to your provider as well.   To learn more about what you can do with MyChart, go to [email or web address].    Your next appointment:   6 month(s) AFTER ECHO COMPLETED   The format for your next appointment:   In Person  Provider:   Skeet Latch, MD   Other Instructions   TRY TO EXERCISE [place]

## 2020-11-01 ENCOUNTER — Other Ambulatory Visit: Payer: Self-pay

## 2020-11-01 DIAGNOSIS — E875 Hyperkalemia: Secondary | ICD-10-CM

## 2020-11-05 ENCOUNTER — Other Ambulatory Visit (INDEPENDENT_AMBULATORY_CARE_PROVIDER_SITE_OTHER): Payer: Medicare PPO

## 2020-11-05 ENCOUNTER — Other Ambulatory Visit: Payer: Self-pay

## 2020-11-05 DIAGNOSIS — E875 Hyperkalemia: Secondary | ICD-10-CM | POA: Diagnosis not present

## 2020-11-05 LAB — BASIC METABOLIC PANEL
BUN: 14 mg/dL (ref 6–23)
CO2: 26 mEq/L (ref 19–32)
Calcium: 8.8 mg/dL (ref 8.4–10.5)
Chloride: 106 mEq/L (ref 96–112)
Creatinine, Ser: 0.88 mg/dL (ref 0.40–1.50)
GFR: 89.41 mL/min (ref >60.00)
Glucose, Bld: 80 mg/dL (ref 70–99)
Potassium: 4.8 mEq/L (ref 3.5–5.1)
Sodium: 140 mEq/L (ref 135–145)

## 2020-12-16 ENCOUNTER — Other Ambulatory Visit (HOSPITAL_BASED_OUTPATIENT_CLINIC_OR_DEPARTMENT_OTHER): Payer: Self-pay

## 2020-12-16 ENCOUNTER — Other Ambulatory Visit: Payer: Self-pay

## 2020-12-16 ENCOUNTER — Ambulatory Visit: Payer: Medicare PPO | Attending: Internal Medicine

## 2020-12-16 DIAGNOSIS — Z23 Encounter for immunization: Secondary | ICD-10-CM

## 2020-12-16 MED ORDER — MODERNA COVID-19 BIVAL BOOSTER 50 MCG/0.5ML IM SUSP
INTRAMUSCULAR | 0 refills | Status: DC
  Filled 2020-12-16: qty 0.5, 1d supply, fill #0

## 2020-12-16 NOTE — Progress Notes (Signed)
   Covid-19 Vaccination Clinic  Name:  Scott Neal    MRN: [id number] DOB: 1955/06/14  12/16/2020  [name] was observed post Covid-19 immunization for 15 minutes without incident. He was provided with Vaccine Information Sheet and instruction to access the V-Safe system.   [name] was instructed to call 911 with any severe reactions post vaccine: Difficulty breathing  Swelling of face and throat  A fast heartbeat  A bad rash all over body  Dizziness and weakness   Immunizations Administered     Name Date Dose VIS Date Route   Moderna Covid-19 vaccine Bivalent Booster 12/16/2020  3:46 PM 0.5 mL 08/17/2020 Intramuscular   Manufacturer: Levan Hurst   Lot: [id number]   [place]: 58483-507-57

## 2021-02-12 ENCOUNTER — Other Ambulatory Visit: Payer: Self-pay | Admitting: Family Medicine

## 2021-02-12 ENCOUNTER — Other Ambulatory Visit (HOSPITAL_BASED_OUTPATIENT_CLINIC_OR_DEPARTMENT_OTHER): Payer: Self-pay | Admitting: Cardiovascular Disease

## 2021-02-13 NOTE — Telephone Encounter (Signed)
Rx(s) sent to pharmacy electronically.  

## 2021-03-19 NOTE — Progress Notes (Signed)
? ?Phone: [phone] ?  ?Subjective:  ?Patient presents today for their annual physical. Chief complaint-noted.  ? ?See problem oriented charting- ?ROS- full  review of systems was completed and negative  ?except for: tinnitus- slightly worsening not pulsatile, allergies, dizziness- known history ? ?The following were reviewed and entered/updated in epic: ?Past Medical History:  ?Diagnosis Date  ? Allergy   ? Cataract   ? removed both eyes  ? Coronary artery calcification 08/22/2020  ? Hyperlipidemia   ? currently under ontrol   ? Hypertension   ? Obesity 08/22/2020  ? Sleep apnea   ? no cpap   ? ?Patient Active Problem List  ? Diagnosis Date Noted  ? Former smoker 03/06/2014  ?  Priority: Low  ? Insomnia 03/06/2014  ?  Priority: Low  ? Obstructive sleep apnea 05/28/2012  ?  Priority: Low  ? Seasonal and perennial allergic rhinitis 06/21/2009  ?  Priority: Low  ? Coronary artery calcification 08/22/2020  ?  Priority: High  ? Ascending aortic aneurysm ([place]) 08/14/2020  ?  Priority: High  ? Aortic atherosclerosis ([place]) 08/13/2020  ?  Priority: Medium   ? Meniere disease, bilateral 07/24/2017  ?  Priority: Medium   ? Hyperglycemia 02/04/2015  ?  Priority: Medium   ? Essential hypertension 03/25/2007  ?  Priority: Medium   ? Hyperlipidemia 07/29/2006  ?  Priority: Medium   ? Obesity 08/22/2020  ?  Priority: Low  ? ?Past Surgical History:  ?Procedure Laterality Date  ? APPENDECTOMY    ? cataract lens implants    ? bilateral  ? COLONOSCOPY    ? x2 last 2010 1 HPP   ? DENTAL SURGERY    ? SINUS IRRIGATION  2003  ? ? ?Family History  ?Problem Relation Age of Onset  ? GER disease Mother   ? Breast cancer Mother   ? Stroke Mother   ? Hypertension Father   ? Heart attack Father   ?     40s, heavy smoker  ? Hypertension Brother   ? Diabetes Brother   ?     x2  ? Hypertension Brother   ?     x2  ? Heart disease Paternal Grandmother   ? Nephrolithiasis Daughter   ? Colon polyps Neg Hx   ? Colon cancer Neg Hx   ? Esophageal cancer  Neg Hx   ? Rectal cancer Neg Hx   ? Stomach cancer Neg Hx   ? ? ?Medications- reviewed and updated ?Current Outpatient Medications  ?Medication Sig Dispense Refill  ? aspirin EC 81 MG tablet Take 81 mg by mouth daily. Swallow whole.    ? losartan (COZAAR) 100 MG tablet Take 1 tablet (100 mg total) by mouth daily. 90 tablet 3  ? metoprolol succinate (TOPROL-XL) 25 MG 24 hr tablet TAKE 1/2 TABLET (12.'5MG'$ )  BY MOUTH DAILY 45 tablet 3  ? rosuvastatin (CRESTOR) 20 MG tablet Take 1 tablet (20 mg total) by mouth daily. 90 tablet 3  ? ?No current facility-administered medications for this visit.  ? ? ?Allergies-reviewed and updated ?No Known Allergies ? ?Social History  ? ?Social History Narrative  ? Married (wife patient outside Network engineer) 3 children, 1 step child. 3 grandkids  ?   ? 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   ?   ? Hobbies: formerly played hockey-wants to get back in, golf, plays music  ? ?Objective  ?Objective:  ?BP 110/68   Pulse 63   Temp 98.4 ?F (36.9 ?C)  Ht '5\' 7"'$  (1.702 m)   Wt 217 lb 6.4 oz (98.6 kg)   SpO2 94%   BMI 34.05 kg/m?  ?Gen: NAD, resting comfortably ?HEENT: Mucous membranes are moist. Oropharynx normal ?Neck: no thyromegaly ?CV: RRR no murmurs rubs or gallops ?Lungs: CTAB no crackles, wheeze, rhonchi ?Abdomen: soft/nontender/nondistended/normal bowel sounds. No rebound or guarding.  ?Ext: no edema ?Skin: warm, dry ?Neuro: grossly normal, moves all extremities, PERRLA ? ?  ?Assessment and Plan  ?66 y.o. male presenting for annual physical.  ?Health Maintenance counseling: ?1. Anticipatory guidance: Patient counseled regarding regular dental exams -q6 months- implant placed on tuesday, eye exams -has seen in September and got new glasses  no new issues,  avoiding smoking and second hand smoke- other than cigars- encouraged cessation , limiting alcohol to 2 beverages per day.  No illicit drugs.  ?2. Risk factor reduction:  Advised patient of need for regular exercise and diet rich and  fruits and vegetables to reduce risk of heart attack and stroke.  ?Exercise-remains active at home and cutting grass-also playing golf 1-2x a month.  Was considering pickleball at last physical- went yesterday and enjoyed it!  ?Diet/weight management-weight down 5 pounds from last physical.  Local Travel and work are still barriers. Has reduced portion sizes  ?Wt Readings from Last 3 Encounters:  ?05/09/21 217 lb 6.4 oz (98.6 kg)  ?10/31/20 217 lb 4.8 oz (98.6 kg)  ?10/30/20 214 lb 9.6 oz (97.3 kg)  ?3. Immunizations/screenings/ancillary studies-fully up-to-date-technically could get another COVID booster if desired  ?-Next physical would be eligible for Prevnar 20  ?Immunization History  ?Administered Date(s) Administered  ? Fluad Quad(high Dose 65+) 10/17/2019, 10/01/2020  ? Influenza Split 11/04/2010, 11/04/2011  ? Influenza Whole 12/07/2006, 11/05/2008, 10/17/2009  ? Influenza,inj,Quad PF,6+ Mos 11/30/2012, 09/30/2016, 10/30/2017, 10/08/2018  ? Influenza-Unspecified 12/16/2013, 10/02/2014, 09/12/2015, 10/30/2017  ? Moderna Covid-19 Vaccine Bivalent Booster 85yr & up 12/16/2020  ? Moderna SARS-COV2 Booster Vaccination 11/19/2019, 05/17/2020  ? Moderna Sars-Covid-2 Vaccination 02/10/2019, 03/11/2019  ? Pneumococcal Conjugate-13 06/04/2020  ? Td 06/06/2006  ? Tdap 06/08/2017  ? Zoster Recombinat (Shingrix) 08/18/2018, 11/15/2018  ? 4. Prostate cancer screening- low risk prior PSA trend-update PSA with labs today ?Lab Results  ?Component Value Date  ? PSA 2.24 05/01/2020  ? PSA 2.19 10/18/2019  ? PSA 2.03 09/21/2018  ? 5. Colon cancer screening - 09/22/18 with 7 year repeat planned.  ?-Did note bright red blood in stool for a few days 4 weeks ago and also had abdominal cramping for a day or so at that time (does have some hemorrhoids- he did stop aspirin for a few days and helped).  We will check stool cards to make sure no lingering microscopic blood and if positive consider GI opinion for early colonoscopy-aspirin  likely contributes  ?6. Skin cancer screening-saw Dr. TDenna Haggardlast year and will see again this year. advised regular sunscreen use. Denies worrisome, changing, or new skin lesions.  ?7. Smoking associated screening (lung cancer screening, AAA screen 65-75, UA)- former smoker-also still using cigars- recommended cessation but not ready to quit. Will get UA. Under 20 pack years by far. Discussed AAA scan- opts in ?833 STD screening -only active with wife  ? ?Status of chronic or acute concerns  ? ?#Ascending aortic aneurysm-follows with Dr. ROval Linsey?S: Initially noted on CT cardiac scoring.  Confirmed on CT coronary morphology.  Most recent echocardiogram 05/01/2021 at 43 mm with plan for 1 year follow-up ?A/P: thankfully stable- continue risk factor modificatoin/current meds  ?  ?#  hypertension ?S: compliant with metoprolol 25 mg extended release and  losartan 100 mg daily. Does not check blood pressure regularly at home ?-last EKG 05/01/20 ?BP Readings from Last 3 Encounters:  ?05/09/21 110/68  ?10/31/20 128/76  ?10/30/20 118/78  ?A/P: Excellent control today-continue current medication.  For ascending aortic aneurysm preferred between 007 and 622 systolic ? ?#Nonobstructive CAD-CT coronary morphology with calcium score of 2033 at 97th percentile for age on 09/04/2020-now follows with Dr. Oval Linsey ?#hyperlipidemia ?#Aortic atherosclerosis ?S: compliant with crestor 20 mg, aspirin 81 mg ?Lab Results  ?Component Value Date  ? CHOL 134 10/30/2020  ? HDL 54.50 10/30/2020  ? Benjamin Perez 68 10/30/2020  ? TRIG 61.0 10/30/2020  ? CHOLHDL 2 10/30/2020  ? A/P: Excellent control in October-he prefers to recheck full lipid panel today ? ?# Hyperglycemia/insulin resistance/prediabetes-family history of diabetes with personal peak A1c of 5.9 ?S: Medication: None ?Exercise and diet-see above ?Lab Results  ?Component Value Date  ? HGBA1C 5.8 10/30/2020  ? HGBA1C 5.9 05/01/2020  ? HGBA1C 5.6 10/18/2019  ? A/P:  hopefully stable or improved-  update A1c today. Continue without meds for now  ? ?#Obstructive sleep apnea-unfortunately unable to tolerate CPAP. Never tried nasal cpap- may revisit later  ? ?# Vertigo-BPPV versus M?ni?re's per Dr. Constance Holster-

## 2021-04-03 ENCOUNTER — Telehealth: Payer: Self-pay | Admitting: Family Medicine

## 2021-04-03 NOTE — Telephone Encounter (Signed)
Copied from [place] [id number]. Topic: Medicare AWV ?>> Apr 03, 2021  1:15 PM Harris-Coley, Hannah Beat wrote: ?Reason for CRM: Left message for patient to schedule Annual Wellness Visit.  Please schedule with Nurse Health Advisor Charlott Rakes, RN at [facility].  Please call [phone] ask for Juliann Pulse ?

## 2021-05-01 ENCOUNTER — Ambulatory Visit (INDEPENDENT_AMBULATORY_CARE_PROVIDER_SITE_OTHER): Payer: Medicare PPO

## 2021-05-01 DIAGNOSIS — I7121 Aneurysm of the ascending aorta, without rupture: Secondary | ICD-10-CM | POA: Diagnosis not present

## 2021-05-01 LAB — ECHOCARDIOGRAM COMPLETE
AR max vel: 3.11 cm2
AV Area VTI: 2.98 cm2
AV Area mean vel: 2.76 cm2
AV Mean grad: 4 mmHg
AV Peak grad: 7.2 mmHg
Ao pk vel: 1.34 m/s
Area-P 1/2: 2.99 cm2
Calc EF: 60.6 %
S' Lateral: 3.79 cm
Single Plane A2C EF: 66.1 %
Single Plane A4C EF: 56.6 %

## 2021-05-09 ENCOUNTER — Encounter: Payer: Self-pay | Admitting: Family Medicine

## 2021-05-09 ENCOUNTER — Ambulatory Visit (INDEPENDENT_AMBULATORY_CARE_PROVIDER_SITE_OTHER): Payer: Medicare PPO | Admitting: Family Medicine

## 2021-05-09 VITALS — BP 110/68 | HR 63 | Temp 98.4°F | Ht 67.0 in | Wt 217.4 lb

## 2021-05-09 DIAGNOSIS — K625 Hemorrhage of anus and rectum: Secondary | ICD-10-CM | POA: Diagnosis not present

## 2021-05-09 DIAGNOSIS — I10 Essential (primary) hypertension: Secondary | ICD-10-CM | POA: Diagnosis not present

## 2021-05-09 DIAGNOSIS — I7121 Aneurysm of the ascending aorta, without rupture: Secondary | ICD-10-CM | POA: Diagnosis not present

## 2021-05-09 DIAGNOSIS — F17290 Nicotine dependence, other tobacco product, uncomplicated: Secondary | ICD-10-CM | POA: Diagnosis not present

## 2021-05-09 DIAGNOSIS — R739 Hyperglycemia, unspecified: Secondary | ICD-10-CM | POA: Diagnosis not present

## 2021-05-09 DIAGNOSIS — R351 Nocturia: Secondary | ICD-10-CM | POA: Diagnosis not present

## 2021-05-09 DIAGNOSIS — E785 Hyperlipidemia, unspecified: Secondary | ICD-10-CM

## 2021-05-09 DIAGNOSIS — Z0000 Encounter for general adult medical examination without abnormal findings: Secondary | ICD-10-CM

## 2021-05-09 LAB — COMPREHENSIVE METABOLIC PANEL
ALT: 26 U/L (ref 0–53)
AST: 19 U/L (ref 0–37)
Albumin: 4.4 g/dL (ref 3.5–5.2)
Alkaline Phosphatase: 41 U/L (ref 39–117)
BUN: 11 mg/dL (ref 6–23)
CO2: 27 mEq/L (ref 19–32)
Calcium: 8.9 mg/dL (ref 8.4–10.5)
Chloride: 107 mEq/L (ref 96–112)
Creatinine, Ser: 0.82 mg/dL (ref 0.40–1.50)
GFR: 91.01 mL/min (ref >60.00)
Glucose, Bld: 107 mg/dL — ABNORMAL HIGH (ref 70–99)
Potassium: 5.6 mEq/L — ABNORMAL HIGH (ref 3.5–5.1)
Sodium: 142 mEq/L (ref 135–145)
Total Bilirubin: 0.7 mg/dL (ref 0.2–1.2)
Total Protein: 6.5 g/dL (ref 6.0–8.3)

## 2021-05-09 LAB — CBC WITH DIFFERENTIAL/PLATELET
Basophils Absolute: 0 10*3/uL (ref 0.0–0.1)
Basophils Relative: 0.8 % (ref 0.0–3.0)
Eosinophils Absolute: 0.3 10*3/uL (ref 0.0–0.7)
Eosinophils Relative: 4.8 % (ref 0.0–5.0)
HCT: 41.4 % (ref 39.0–52.0)
Hemoglobin: 13.7 g/dL (ref 13.0–17.0)
Lymphocytes Relative: 20.7 % (ref 12.0–46.0)
Lymphs Abs: 1.2 10*3/uL (ref 0.7–4.0)
MCHC: 33.1 g/dL (ref 30.0–36.0)
MCV: 96 fl (ref 78.0–100.0)
Monocytes Absolute: 0.6 10*3/uL (ref 0.1–1.0)
Monocytes Relative: 11.4 % (ref 3.0–12.0)
Neutro Abs: 3.5 10*3/uL (ref 1.4–7.7)
Neutrophils Relative %: 62.3 % (ref 43.0–77.0)
Platelets: 195 10*3/uL (ref 150.0–400.0)
RBC: 4.31 Mil/uL (ref 4.22–5.81)
RDW: 13.9 % (ref 11.5–15.5)
WBC: 5.6 10*3/uL (ref 4.0–10.5)

## 2021-05-09 LAB — LIPID PANEL
Cholesterol: 130 mg/dL (ref 0–200)
HDL: 53.9 mg/dL (ref >39.00)
LDL Cholesterol: 63 mg/dL (ref 0–99)
NonHDL: 76.48
Total CHOL/HDL Ratio: 2
Triglycerides: 68 mg/dL (ref 0.0–149.0)
VLDL: 13.6 mg/dL (ref 0.0–40.0)

## 2021-05-09 LAB — HEMOGLOBIN A1C: Hgb A1c MFr Bld: 5.7 % (ref 4.6–6.5)

## 2021-05-09 LAB — PSA: PSA: 2.71 ng/mL (ref 0.10–4.00)

## 2021-05-09 MED ORDER — ROSUVASTATIN CALCIUM 20 MG PO TABS: 20.0000 mg | ORAL_TABLET | Freq: Every day | ORAL | 3 refills | Status: DC

## 2021-05-09 MED ORDER — LOSARTAN POTASSIUM 100 MG PO TABS: 100.0000 mg | ORAL_TABLET | Freq: Every day | ORAL | 3 refills | Status: DC

## 2021-05-09 MED ORDER — METOPROLOL SUCCINATE ER 25 MG PO TB24: ORAL_TABLET | ORAL | 3 refills | Status: DC

## 2021-05-09 NOTE — Patient Instructions (Addendum)
Please give stool cards as well as instructions before he leaves ? ?Please stop by lab before you go ?If you have mychart- we will send your results within 3 business days of [place] receiving them.  ?If you do not have mychart- we will call you about results within 5 business days of [place] receiving them.  ?*please also note that you will see labs on mychart as soon as they post. I will later go in and write notes on them- will say "notes from Dr. Yong Channel"  ? ?We will call you within two weeks about your referral for abdominal aortic aneurysm screening. If you do not hear within 2 weeks, give [place] a call.   ? ?Recommended follow up: Return in about 6 months (around 11/09/2021) for followup or sooner if needed.Schedule b4 you leave. ?

## 2021-05-12 ENCOUNTER — Other Ambulatory Visit (INDEPENDENT_AMBULATORY_CARE_PROVIDER_SITE_OTHER): Payer: Medicare PPO

## 2021-05-12 ENCOUNTER — Other Ambulatory Visit: Payer: Self-pay

## 2021-05-12 DIAGNOSIS — E875 Hyperkalemia: Secondary | ICD-10-CM | POA: Diagnosis not present

## 2021-05-12 LAB — BASIC METABOLIC PANEL
BUN: 20 mg/dL (ref 6–23)
CO2: 25 mEq/L (ref 19–32)
Calcium: 9.3 mg/dL (ref 8.4–10.5)
Chloride: 104 mEq/L (ref 96–112)
Creatinine, Ser: 1 mg/dL (ref 0.40–1.50)
GFR: 77.97 mL/min (ref >60.00)
Glucose, Bld: 100 mg/dL — ABNORMAL HIGH (ref 70–99)
Potassium: 4.3 mEq/L (ref 3.5–5.1)
Sodium: 139 mEq/L (ref 135–145)

## 2021-05-12 LAB — POC URINALSYSI DIPSTICK (AUTOMATED)
Bilirubin, UA: NEGATIVE
Blood, UA: NEGATIVE
Glucose, UA: NEGATIVE
Ketones, UA: NEGATIVE
Leukocytes, UA: NEGATIVE
Nitrite, UA: NEGATIVE
Protein, UA: NEGATIVE
Spec Grav, UA: >=1.03 — AB (ref 1.010–1.025)
Urobilinogen, UA: 0.2 E.U./dL
pH, UA: 5.5 (ref 5.0–8.0)

## 2021-05-13 ENCOUNTER — Other Ambulatory Visit (INDEPENDENT_AMBULATORY_CARE_PROVIDER_SITE_OTHER): Payer: Medicare PPO

## 2021-05-13 DIAGNOSIS — K625 Hemorrhage of anus and rectum: Secondary | ICD-10-CM

## 2021-05-13 LAB — FECAL OCCULT BLOOD, IMMUNOCHEMICAL: Fecal Occult Bld: NEGATIVE

## 2021-05-15 ENCOUNTER — Other Ambulatory Visit (HOSPITAL_BASED_OUTPATIENT_CLINIC_OR_DEPARTMENT_OTHER): Payer: Self-pay

## 2021-05-15 ENCOUNTER — Ambulatory Visit: Payer: Medicare PPO | Attending: Internal Medicine

## 2021-05-15 ENCOUNTER — Other Ambulatory Visit: Payer: Medicare PPO

## 2021-05-15 DIAGNOSIS — Z23 Encounter for immunization: Secondary | ICD-10-CM

## 2021-05-15 MED ORDER — MODERNA COVID-19 BIVAL BOOSTER 50 MCG/0.5ML IM SUSP
INTRAMUSCULAR | 0 refills | Status: DC
  Filled 2021-05-15: qty 0.5, 1d supply, fill #0

## 2021-05-15 NOTE — Progress Notes (Signed)
? ?  Covid-[facility] Vaccination [facility] ? ?Name:  Scott Neal    ?MRN: [id number] ?DOB: 1955-07-15 ? ?05/15/2021 ? ?[name] was observed post Covid-19 immunization for 15 minutes without incident. He was provided with Vaccine Information Sheet and instruction to access the V-Safe system.  ? ?[name] was instructed to call 911 with any severe reactions post vaccine: ?Difficulty breathing  ?Swelling of face and throat  ?A fast heartbeat  ?A bad rash all over body  ?Dizziness and weakness  ? ?Immunizations Administered   ? ? Name Date Dose VIS Date Route  ? Moderna Covid-19 vaccine Bivalent Booster 05/15/2021  2:30 PM 0.5 mL 08/17/2020 Intramuscular  ? Manufacturer: Moderna  ? Lot: [id number]  ? [place]: 80777-282-99  ? ?  ? ? ?

## 2021-06-03 ENCOUNTER — Other Ambulatory Visit: Payer: Self-pay

## 2021-06-03 ENCOUNTER — Emergency Department (HOSPITAL_BASED_OUTPATIENT_CLINIC_OR_DEPARTMENT_OTHER)
Admission: EM | Admit: 2021-06-03 | Discharge: 2021-06-03 | Disposition: A | Discharge status: Home / Self Care | Payer: Medicare PPO | Attending: Emergency Medicine | Admitting: Emergency Medicine

## 2021-06-03 ENCOUNTER — Emergency Department (HOSPITAL_BASED_OUTPATIENT_CLINIC_OR_DEPARTMENT_OTHER): Payer: Medicare PPO | Admitting: Radiology

## 2021-06-03 ENCOUNTER — Encounter (HOSPITAL_BASED_OUTPATIENT_CLINIC_OR_DEPARTMENT_OTHER): Payer: Self-pay | Admitting: Obstetrics and Gynecology

## 2021-06-03 DIAGNOSIS — Z79899 Other long term (current) drug therapy: Secondary | ICD-10-CM | POA: Insufficient documentation

## 2021-06-03 DIAGNOSIS — I10 Essential (primary) hypertension: Secondary | ICD-10-CM | POA: Insufficient documentation

## 2021-06-03 DIAGNOSIS — S2231XA Fracture of one rib, right side, initial encounter for closed fracture: Secondary | ICD-10-CM | POA: Insufficient documentation

## 2021-06-03 DIAGNOSIS — W01198A Fall on same level from slipping, tripping and stumbling with subsequent striking against other object, initial encounter: Secondary | ICD-10-CM | POA: Diagnosis not present

## 2021-06-03 DIAGNOSIS — Z7982 Long term (current) use of aspirin: Secondary | ICD-10-CM | POA: Diagnosis not present

## 2021-06-03 DIAGNOSIS — R9431 Abnormal electrocardiogram [ECG] [EKG]: Secondary | ICD-10-CM | POA: Diagnosis not present

## 2021-06-03 DIAGNOSIS — Y9315 Activity, underwater diving and snorkeling: Secondary | ICD-10-CM | POA: Insufficient documentation

## 2021-06-03 DIAGNOSIS — S299XXA Unspecified injury of thorax, initial encounter: Secondary | ICD-10-CM | POA: Diagnosis present

## 2021-06-03 NOTE — ED Notes (Signed)
Pt ambulated out of ED without letting any staff know prior to receiving his discharge paperwork.

## 2021-06-03 NOTE — ED Provider Notes (Signed)
[facility] EMERGENCY DEPT Provider Note   CSN: [id number] Arrival date & time: 06/03/21  5885     History  Chief Complaint  Patient presents with   Scott Neal    ATTILA MCCARTHY is a 66 y.o. male.  Pt is a 66 yo male with a pmhx significant for htn, sleep apnea, hyperlipidemia, and obesity.  Pt was snorkeling on Thursday, 5/25.  Pt was in [place].  He was getting out of the water after snorkeling and slipped and fell, hitting his right side.  Pt denies any other injuries.  Pain is still there.  He was able to snorkel the rest of the weekend; however.  No sob.      Home Medications Prior to Admission medications   Medication Sig Start Date End Date Taking? Authorizing Provider  aspirin EC 81 MG tablet Take 81 mg by mouth daily. Swallow whole.    [provider]  COVID-19 mRNA bivalent vaccine, Moderna, (MODERNA COVID-19 BIVAL BOOSTER) 50 MCG/0.5ML injection Inject into the muscle. 05/15/21   Carlyle Basques, MD  losartan (COZAAR) 100 MG tablet Take 1 tablet (100 mg total) by mouth daily. 05/09/21   Marin Olp, MD  metoprolol succinate (TOPROL-XL) 25 MG 24 hr tablet TAKE 1/2 TABLET (12.'5MG'$ )  BY MOUTH DAILY 05/09/21   Marin Olp, MD  rosuvastatin (CRESTOR) 20 MG tablet Take 1 tablet (20 mg total) by mouth daily. 05/09/21   Marin Olp, MD      Allergies    Patient has no known allergies.    Review of Systems   Review of Systems  Musculoskeletal:        Right sided rib pain  All other systems reviewed and are negative.  Physical Exam Updated Vital Signs BP (!) 158/103 (BP Location: Right Arm)   Pulse 66   Temp 98.3 F (36.8 C)   Resp 18   Ht '5\' 7"'$  (1.702 m)   Wt 97.5 kg   SpO2 97%   BMI 33.67 kg/m  Physical Exam Vitals and nursing note reviewed.  Constitutional:      Appearance: Normal appearance.  HENT:     Head: Normocephalic.     Right Ear: External ear normal.     Left Ear: External ear normal.     Nose: Nose normal.      Mouth/Throat:     Mouth: Mucous membranes are moist.     Pharynx: Oropharynx is clear.  Eyes:     Extraocular Movements: Extraocular movements intact.     Conjunctiva/sclera: Conjunctivae normal.     Pupils: Pupils are equal, round, and reactive to light.  Cardiovascular:     Rate and Rhythm: Normal rate and regular rhythm.     Pulses: Normal pulses.     Heart sounds: Normal heart sounds.  Pulmonary:     Effort: Pulmonary effort is normal.     Breath sounds: Normal breath sounds.  Chest:    Abdominal:     General: Abdomen is flat. Bowel sounds are normal.     Palpations: Abdomen is soft.  Musculoskeletal:     Cervical back: Normal range of motion and neck supple.  Skin:    General: Skin is warm.     Capillary Refill: Capillary refill takes less than 2 seconds.  Neurological:     General: No focal deficit present.     Mental Status: He is alert and oriented to person, place, and time.  Psychiatric:  Mood and Affect: Mood normal.        Behavior: Behavior normal.    ED Results / Procedures / Treatments   Labs (all labs ordered are listed, but only abnormal results are displayed) Labs Reviewed - No data to display  EKG EKG Interpretation  Date/Time:  Tuesday Jun 03 2021 09:35:09 EDT Ventricular Rate:  69 PR Interval:  156 QRS Duration: 96 QT Interval:  412 QTC Calculation: 441 R Axis:   -26 Text Interpretation: Normal sinus rhythm Cannot rule out Anterior infarct , age undetermined Abnormal ECG No previous ECGs available Confirmed by Isla Pence [phone]) on 06/03/2021 10:22:21 AM  Radiology DG Ribs Unilateral W/Chest Right  Result Date: 06/03/2021 CLINICAL DATA:  Fall, right rib pain EXAM: RIGHT RIBS AND CHEST - 3+ VIEW COMPARISON:  None Available. FINDINGS: Cardiac and mediastinal contours normal. Pulmonary vascularity normal. Calcified granuloma left upper lobe. Lungs clear without infiltrate or effusion. No pneumothorax Fracture right sixth rib laterally  with mild displacement. No other acute fracture identified. Possible additional chronic right rib fractures. IMPRESSION: Mildly displaced fracture right sixth rib No acute cardiopulmonary abnormality. Electronically Signed   By: Franchot Gallo M.D.   On: 06/03/2021 10:33    Procedures Procedures    Medications Ordered in ED Medications - No data to display  ED Course/ Medical Decision Making/ A&P                           Medical Decision Making Amount and/or Complexity of Data Reviewed Radiology: ordered.   This patient presents to the ED for concern of rib pain, this involves an extensive number of treatment options, and is a complaint that carries with it a high risk of complications and morbidity.  The differential diagnosis includes rib fx, rib contusion, ptx   Co morbidities that complicate the patient evaluation  htn, sleep apnea, hyperlipidemia, and obesity.   Additional history obtained:  Additional history obtained from epic chart review   Imaging Studies ordered:  I ordered imaging studies including CXR/ribs  I independently visualized and interpreted imaging which showed    IMPRESSION:  Mildly displaced fracture right sixth rib     No acute cardiopulmonary abnormality.   I agree with the radiologist interpretation   Cardiac Monitoring:  The patient was maintained on a cardiac monitor.  I personally viewed and interpreted the cardiac monitored which showed an underlying rhythm of: nsr   Medicines ordered and prescription drug management:   I have reviewed the patients home medicines and have made adjustments as needed   Test Considered:  CT chest, but pain is under control and he is not sob   Problem List / ED Course:  Rib fx:  Pt did not want anything for pain here or a rx at d/c.  He is given an incentive spirometer.  He will take ibuprofen and tylenol.  He is to return if worse. F/u with pcp.   Reevaluation:  After the interventions  noted above, I reevaluated the patient and found that they have :improved   Social Determinants of Health:  Lives at home   Dispostion:  After consideration of the diagnostic results and the patients response to treatment, I feel that the patent would benefit from discharge with outpatient f/u.          Final Clinical Impression(s) / ED Diagnoses Final diagnoses:  Closed fracture of one rib of right side, initial encounter    Rx / DC  Orders ED Discharge Orders     None         Isla Pence, MD 06/03/21 1055

## 2021-06-03 NOTE — ED Notes (Signed)
Patient left room prior to IS instruction.

## 2021-06-03 NOTE — ED Triage Notes (Signed)
Patient reports to the ER post fall Friday. Patient reports he was snorkeling and slipped and fell hard on his right side, unsure of what he hit.

## 2021-06-10 ENCOUNTER — Ambulatory Visit: Payer: Medicare PPO | Admitting: Dermatology

## 2021-06-10 DIAGNOSIS — S80861A Insect bite (nonvenomous), right lower leg, initial encounter: Secondary | ICD-10-CM | POA: Diagnosis not present

## 2021-06-10 DIAGNOSIS — L729 Follicular cyst of the skin and subcutaneous tissue, unspecified: Secondary | ICD-10-CM

## 2021-06-10 DIAGNOSIS — Z1283 Encounter for screening for malignant neoplasm of skin: Secondary | ICD-10-CM

## 2021-06-10 DIAGNOSIS — D1801 Hemangioma of skin and subcutaneous tissue: Secondary | ICD-10-CM | POA: Diagnosis not present

## 2021-06-10 DIAGNOSIS — W57XXXA Bitten or stung by nonvenomous insect and other nonvenomous arthropods, initial encounter: Secondary | ICD-10-CM | POA: Diagnosis not present

## 2021-06-10 DIAGNOSIS — L821 Other seborrheic keratosis: Secondary | ICD-10-CM

## 2021-06-28 ENCOUNTER — Encounter: Payer: Self-pay | Admitting: Dermatology

## 2021-07-24 ENCOUNTER — Ambulatory Visit (INDEPENDENT_AMBULATORY_CARE_PROVIDER_SITE_OTHER): Payer: Medicare PPO

## 2021-07-24 DIAGNOSIS — Z0000 Encounter for general adult medical examination without abnormal findings: Secondary | ICD-10-CM | POA: Diagnosis not present

## 2021-07-24 NOTE — Progress Notes (Signed)
Virtual Visit via Telephone Note  I connected with  Scott Neal on 07/24/21 at 10:15 AM EDT by telephone and verified that I am speaking with the correct person using two identifiers.  Medicare Annual Wellness visit completed telephonically due to Covid-19 pandemic.   Persons participating in this call: This Health Coach and this patient.   Location: Patient: Home Provider: Office   I discussed the limitations, risks, security and privacy concerns of performing an evaluation and management service by telephone and the availability of in person appointments. The patient expressed understanding and agreed to proceed.  Unable to perform video visit due to video visit attempted and failed and/or patient does not have video capability.   Some vital signs may be absent or patient reported.   Scott Brace, LPN   Subjective:   Scott Neal is a 66 y.o. male who presents for an Initial Medicare Annual Wellness Visit.  Review of Systems     Cardiac Risk Factors include: advanced age (>17mn, >>54women);hypertension;dyslipidemia;male gender;obesity (BMI >30kg/m2)     Objective:    There were no vitals filed for this visit. There is no height or weight on file to calculate BMI.     07/24/2021   10:21 AM 06/03/2021    9:30 AM 05/28/2015    1:51 PM  Advanced Directives  Does Patient Have a Medical Advance Directive? Yes No Yes  Type of Advance Directive Healthcare [place] of [place]? No - copy requested    Would patient like information on creating a medical advance directive?  No - Patient declined     Current Medications (verified) Outpatient Encounter Medications as of 07/24/2021  Medication Sig   aspirin EC 81 MG tablet Take 81 mg by mouth daily. Swallow whole.   losartan (COZAAR) 100 MG tablet Take 1 tablet (100 mg total) by mouth daily.   metoprolol succinate (TOPROL-XL) 25 MG 24 hr tablet TAKE 1/2 TABLET  (12.'5MG'$ )  BY MOUTH DAILY   rosuvastatin (CRESTOR) 20 MG tablet Take 1 tablet (20 mg total) by mouth daily.   COVID-19 mRNA bivalent vaccine, Moderna, (MODERNA COVID-19 BIVAL BOOSTER) 50 MCG/0.5ML injection Inject into the muscle.   No facility-administered encounter medications on file as of 07/24/2021.    Allergies (verified) Patient has no known allergies.   History: Past Medical History:  Diagnosis Date   Allergy    Cataract    removed both eyes   Coronary artery calcification 08/22/2020   Hyperlipidemia    currently under ontrol    Hypertension    Obesity 08/22/2020   Sleep apnea    no cpap    Past Surgical History:  Procedure Laterality Date   APPENDECTOMY     cataract lens implants     bilateral   COLONOSCOPY     x2 last 2010 1 HPP    DENTAL SURGERY     SINUS IRRIGATION  2003   Family History  Problem Relation Age of Onset   GER disease Mother    Breast cancer Mother    Stroke Mother    Hypertension Father    Heart attack Father        542s heavy smoker   Hypertension Brother    Diabetes Brother        x2   Hypertension Brother        x2   Heart disease Paternal Grandmother    Nephrolithiasis Daughter  Colon polyps Neg Hx    Colon cancer Neg Hx    Esophageal cancer Neg Hx    Rectal cancer Neg Hx    Stomach cancer Neg Hx    Social History   Socioeconomic History   Marital status: Married    Spouse name: Not on file   Number of children: Not on file   Years of education: Not on file   Highest education level: Not on file  Occupational History   Not on file  Tobacco Use   Smoking status: Some Days    Packs/day: 0.75    Years: 4.00    Total pack years: 3.00    Types: Cigarettes, Cigars    Last attempt to quit: 03/11/1997    Years since quitting: 24.3   Smokeless tobacco: Never  Vaping Use   Vaping Use: Never used  Substance and Sexual Activity   Alcohol use: Yes    Alcohol/week: 10.0 standard drinks of alcohol    Types: 10 Standard drinks  or equivalent per week   Drug use: No   Sexual activity: Yes  Other Topics Concern   Not on file  Social History Narrative   Married (wife patient outside practice) 3 children, 1 step child. 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       Hobbies: formerly played hockey-wants to get back in, golf, plays music   Social Determinants of Health   Financial Resource Strain: Low Risk  (07/24/2021)   Overall Financial Resource Strain (CARDIA)    Difficulty of Paying Living Expenses: Not hard at all  Food Insecurity: No Food Insecurity (07/24/2021)   Hunger Vital Sign    Worried About Running Out of Food in the Last Year: Never true    Ran Out of Food in the Last Year: Never true  Transportation Needs: No Transportation Needs (07/24/2021)   PRAPARE - Hydrologist (Medical): No    Lack of Transportation (Non-Medical): No  Physical Activity: Insufficiently Active (07/24/2021)   Exercise Vital Sign    Days of Exercise per Week: 1 day    Minutes of Exercise per Session: 30 min  Stress: No Stress Concern Present (07/24/2021)   [place]    Feeling of Stress : Only a little  Social Connections: Socially Integrated (07/24/2021)   Social Connection and Isolation Panel [NHANES]    Frequency of Communication with Friends and Family: More than three times a week    Frequency of Social Gatherings with Friends and Family: More than three times a week    Attends Religious Services: More than 4 times per year    Active Member of [employer] or Organizations: Yes    Attends Archivist Meetings: 1 to 4 times per year    Marital Status: Married    Tobacco Counseling Ready to quit: Not Answered Counseling given: Not Answered   Clinical Intake:  Pre-visit preparation completed: Yes  Pain : No/denies pain     BMI - recorded: 34.05 Nutritional Status: BMI > 30  Obese Nutritional Risks:  None Diabetes: No  How often do you need to have someone help you when you read instructions, pamphlets, or other written materials from your doctor or pharmacy?: 1 - Never  Diabetic?no  Interpreter Needed?: No  Information entered by :: Charlott Rakes, LPN   Activities of Daily Living    07/24/2021   10:22 AM  In  your present state of health, do you have any difficulty performing the following activities:  Hearing? 0  Vision? 0  Difficulty concentrating or making decisions? 0  Walking or climbing stairs? 0  Dressing or bathing? 0  Doing errands, shopping? 0  Preparing Food and eating ? N  Using the Toilet? N  In the past six months, have you accidently leaked urine? N  Do you have problems with loss of bowel control? N  Managing your Medications? N  Managing your Finances? N  Housekeeping or managing your Housekeeping? N    Patient Care Team: Marin Olp, MD as PCP - General (Family Medicine) Lavonna Monarch, MD as Consulting Physician (Dermatology)  Indicate any recent Medical Services you may have received from other than [facility] providers in the past year (date may be approximate).     Assessment:   This is a routine wellness examination for [place].  Hearing/Vision screen Hearing Screening - Comments:: Pt denies any hearing issues  Vision Screening - Comments:: Pt follows up with [facility]   Dietary issues and exercise activities discussed: Current Exercise Habits: Home exercise routine, Type of exercise: Other - see comments, Time (Minutes): 30, Frequency (Times/Week): 1, Weekly Exercise (Minutes/Week): 30   Goals Addressed             This Visit's Progress    Patient Stated       Lose some weight        Depression Screen    07/24/2021   10:20 AM 10/30/2020    8:51 AM 05/09/2020   11:13 AM 10/18/2019   11:48 AM 02/16/2019    8:48 AM 08/18/2018    2:57 PM 07/23/2017    1:40 PM  PHQ 2/9 Scores  PHQ - 2 Score 0 0 0 0 0 0 0  PHQ- 9  Score   0  0      Fall Risk    07/24/2021   10:22 AM 10/30/2020    8:51 AM 10/18/2019   11:48 AM 02/16/2019    8:48 AM 05/28/2015    1:52 PM  Fall Risk   Falls in the past year? 1 0 0 1 No  Number falls in past yr: 1 0  0   Injury with Fall? 1 0  1   Comment snorkeling      Risk for fall due to : Impaired vision   Other (Comment)   Follow up Falls prevention discussed Falls evaluation completed  Education provided     [place]:  Any stairs in or around the home? Yes  If so, are there any without handrails? No  Home free of loose throw rugs in walkways, pet beds, electrical cords, etc? Yes  Adequate lighting in your home to reduce risk of falls? Yes   ASSISTIVE DEVICES UTILIZED TO PREVENT FALLS:  Life alert? No  Use of a cane, walker or w/c? No  Grab bars in the bathroom? Yes  Shower chair or bench in shower? No  Elevated toilet seat or a handicapped toilet? No   TIMED UP AND GO:  Was the test performed? No .   Cognitive Function: pt declined at this time         Immunizations Immunization History  Administered Date(s) Administered   Fluad Quad(high Dose 65+) 10/17/2019, 10/01/2020   Influenza Split 11/04/2010, 11/04/2011   Influenza Whole 12/07/2006, 11/05/2008, 10/17/2009   Influenza,inj,Quad PF,6+ Mos 11/30/2012, 09/30/2016, 10/30/2017, 10/08/2018   Influenza-Unspecified 12/16/2013, 10/02/2014, 09/12/2015,  10/30/2017   Moderna Covid-19 Vaccine Bivalent Booster 76yr & up 12/16/2020, 05/15/2021   Moderna SARS-COV2 Booster Vaccination 11/19/2019, 05/17/2020   Moderna Sars-Covid-2 Vaccination 02/10/2019, 03/11/2019   Pneumococcal Conjugate-13 06/04/2020   Td 06/06/2006   Tdap 06/08/2017   Zoster Recombinat (Shingrix) 08/18/2018, 11/15/2018    TDAP status: Up to date  Flu Vaccine status: Up to date  Pneumococcal vaccine status: Up to date  Covid-19 vaccine status: Completed vaccines  Qualifies for Shingles Vaccine? Yes    Zostavax completed Yes   Shingrix Completed?: Yes  Screening Tests Health Maintenance  Topic Date Due   Pneumonia Vaccine 67 Years old (2 - PPSV23 or PCV20) 07/30/2020   INFLUENZA VACCINE  08/05/2021   COLONOSCOPY (Pts 45-467yrInsurance coverage will need to be confirmed)  09/21/2025   TETANUS/TDAP  06/09/2027   COVID-19 Vaccine  Completed   Hepatitis C Screening  Completed   Zoster Vaccines- Shingrix  Completed   HPV VACCINES  Aged Out    Health Maintenance  Health Maintenance Due  Topic Date Due   Pneumonia Vaccine 6570Years old (2 - PPSV23 or PCV20) 07/30/2020    Colorectal cancer screening: Type of screening: Colonoscopy. Completed 09/22/18. Repeat every 7 years  Additional Screening:  Hepatitis C Screening: Completed 02/27/15  Vision Screening: Recommended annual ophthalmology exams for early detection of glaucoma and other disorders of the eye. Is the patient up to date with their annual eye exam?  Yes  Who is the provider or what is the name of the office in which the patient [facility]ends [facility] exams[facility]  If pt is not established with a provider, would they like to be referred to a provider to establish care? No .   Dental Screening: Recommended annual dental exams for proper oral hygiene  Community Resource Referral / Chronic Care Management: CRR required this visit?  No   CCM required this visit?  No      Plan:     I have personally reviewed and noted the following in the patient's chart:   Medical and social history Use of alcohol, tobacco or illicit drugs  Current medications and supplements including opioid prescriptions. Patient is not currently taking opioid prescriptions. Functional ability and status Nutritional status Physical activity Advanced directives List of other physicians Hospitalizations, surgeries, and ER visits in previous 12 months Vitals Screenings to include cognitive, depression, and falls Referrals and  appointments  In addition, I have reviewed and discussed with patient certain preventive protocols, quality metrics, and best practice recommendations. A written personalized care plan for preventive services as well as general preventive health recommendations were provided to patient.     TiWillette BraceLPN   07/09/61/1497 Nurse Notes: None

## 2021-07-24 NOTE — Patient Instructions (Signed)
[name] , Thank you for taking time to come for your Medicare Wellness Visit. I appreciate your ongoing commitment to your health goals. Please review the following plan we discussed and let me know if I can assist you in the future.   Screening recommendations/referrals: Colonoscopy: Done 05/13/21 FOBT, colonoscopy 09/22/18 repeat every 7 years  Recommended yearly ophthalmology/optometry visit for glaucoma screening and checkup Recommended yearly dental visit for hygiene and checkup  Vaccinations: Influenza vaccine: done 10/01/20 repeat every year  Pneumococcal vaccine: due and discussed  Tdap vaccine: done 06/08/17 repeat every 10 years  Shingles vaccine: completed 8/13, 11/15/18   Covid-19: completed 2/5, 3/6, 11/19/19 & 5/13, 12/16/20 & 05/15/21  Advanced directives: Please bring a copy of your health care power of attorney and living will to the office at your convenience.  Conditions/risks identified: lose some weight   Next appointment: Follow up in one year for your annual wellness visit.   Preventive Care 4 Years and Older, Male Preventive care refers to lifestyle choices and visits with your health care provider that can promote health and wellness. What does preventive care include? A yearly physical exam. This is also called an annual well check. Dental exams once or twice a year. Routine eye exams. Ask your health care provider how often you should have your eyes checked. Personal lifestyle choices, including: Daily care of your teeth and gums. Regular physical activity. Eating a healthy diet. Avoiding tobacco and drug use. Limiting alcohol use. Practicing safe sex. Taking low doses of aspirin every day. Taking vitamin and mineral supplements as recommended by your health care provider. What happens during an annual well check? The services and screenings done by your health care provider during your annual well check will depend on your age, overall health, lifestyle  risk factors, and family history of disease. Counseling  Your health care provider may ask you questions about your: Alcohol use. Tobacco use. Drug use. Emotional well-being. Home and relationship well-being. Sexual activity. Eating habits. History of falls. Memory and ability to understand (cognition). Work and work Statistician. Screening  You may have the following tests or measurements: Height, weight, and BMI. Blood pressure. Lipid and cholesterol levels. These may be checked every 5 years, or more frequently if you are over 98 years old. Skin check. Lung cancer screening. You may have this screening every year starting at age 47 if you have a 30-pack-year history of smoking and currently smoke or have quit within the past 15 years. Fecal occult blood test (FOBT) of the stool. You may have this test every year starting at age 62. Flexible sigmoidoscopy or colonoscopy. You may have a sigmoidoscopy every 5 years or a colonoscopy every 10 years starting at age 50. Prostate cancer screening. Recommendations will vary depending on your family history and other risks. Hepatitis C blood test. Hepatitis B blood test. Sexually transmitted disease (STD) testing. Diabetes screening. This is done by checking your blood sugar (glucose) after you have not eaten for a while (fasting). You may have this done every 1-3 years. Abdominal aortic aneurysm (AAA) screening. You may need this if you are a current or former smoker. Osteoporosis. You may be screened starting at age 71 if you are at high risk. Talk with your health care provider about your test results, treatment options, and if necessary, the need for more tests. Vaccines  Your health care provider may recommend certain vaccines, such as: Influenza vaccine. This is recommended every year. Tetanus, diphtheria, and acellular pertussis (Tdap,  Td) vaccine. You may need a Td booster every 10 years. Zoster vaccine. You may need this after age  53. Pneumococcal 13-valent conjugate (PCV13) vaccine. One dose is recommended after age 31. Pneumococcal polysaccharide (PPSV23) vaccine. One dose is recommended after age 79. Talk to your health care provider about which screenings and vaccines you need and how often you need them. This information is not intended to replace advice given to you by your health care provider. Make sure you discuss any questions you have with your health care provider. Document Released: 01/18/2015 Document Revised: 09/11/2015 Document Reviewed: 10/23/2014 Elsevier Interactive Patient Education  2017 [place] Prevention in the Home Falls can cause injuries. They can happen to people of all ages. There are many things you can do to make your home safe and to help prevent falls. What can I do on the outside of my home? Regularly fix the edges of walkways and driveways and fix any cracks. Remove anything that might make you trip as you walk through a door, such as a raised step or threshold. Trim any bushes or trees on the path to your home. Use bright outdoor lighting. Clear any walking paths of anything that might make someone trip, such as rocks or tools. Regularly check to see if handrails are loose or broken. Make sure that both sides of any steps have handrails. Any raised decks and porches should have guardrails on the edges. Have any leaves, snow, or ice cleared regularly. Use sand or salt on walking paths during winter. Clean up any spills in your garage right away. This includes oil or grease spills. What can I do in the bathroom? Use night lights. Install grab bars by the toilet and in the tub and shower. Do not use towel bars as grab bars. Use non-skid mats or decals in the tub or shower. If you need to sit down in the shower, use a plastic, non-slip stool. Keep the floor dry. Clean up any water that spills on the floor as soon as it happens. Remove soap buildup in the tub or shower  regularly. Attach bath mats securely with double-sided non-slip rug tape. Do not have throw rugs and other things on the floor that can make you trip. What can I do in the bedroom? Use night lights. Make sure that you have a light by your bed that is easy to reach. Do not use any sheets or blankets that are too big for your bed. They should not hang down onto the floor. Have a firm chair that has side arms. You can use this for support while you get dressed. Do not have throw rugs and other things on the floor that can make you trip. What can I do in the kitchen? Clean up any spills right away. Avoid walking on wet floors. Keep items that you use a lot in easy-to-reach places. If you need to reach something above you, use a strong step stool that has a grab bar. Keep electrical cords out of the way. Do not use floor polish or wax that makes floors slippery. If you must use wax, use non-skid floor wax. Do not have throw rugs and other things on the floor that can make you trip. What can I do with my stairs? Do not leave any items on the stairs. Make sure that there are handrails on both sides of the stairs and use them. Fix handrails that are broken or loose. Make sure that handrails are as  long as the stairways. Check any carpeting to make sure that it is firmly attached to the stairs. Fix any carpet that is loose or worn. Avoid having throw rugs at the top or bottom of the stairs. If you do have throw rugs, attach them to the floor with carpet tape. Make sure that you have a light switch at the top of the stairs and the bottom of the stairs. If you do not have them, ask someone to add them for you. What else can I do to help prevent falls? Wear shoes that: Do not have high heels. Have rubber bottoms. Are comfortable and fit you well. Are closed at the toe. Do not wear sandals. If you use a stepladder: Make sure that it is fully opened. Do not climb a closed stepladder. Make sure that  both sides of the stepladder are locked into place. Ask someone to hold it for you, if possible. Clearly mark and make sure that you can see: Any grab bars or handrails. First and last steps. Where the edge of each step is. Use tools that help you move around (mobility aids) if they are needed. These include: Canes. Walkers. Scooters. Crutches. Turn on the lights when you go into a dark area. Replace any light bulbs as soon as they burn out. Set up your furniture so you have a clear path. Avoid moving your furniture around. If any of your floors are uneven, fix them. If there are any pets around you, be aware of where they are. Review your medicines with your doctor. Some medicines can make you feel dizzy. This can increase your chance of falling. Ask your doctor what other things that you can do to help prevent falls. This information is not intended to replace advice given to you by your health care provider. Make sure you discuss any questions you have with your health care provider. Document Released: 10/18/2008 Document Revised: 05/30/2015 Document Reviewed: 01/26/2014 Elsevier Interactive Patient Education  2017 [employer].

## 2021-09-23 ENCOUNTER — Encounter: Payer: Self-pay | Admitting: Family Medicine

## 2021-09-23 ENCOUNTER — Other Ambulatory Visit (HOSPITAL_BASED_OUTPATIENT_CLINIC_OR_DEPARTMENT_OTHER): Payer: Self-pay

## 2021-09-23 ENCOUNTER — Ambulatory Visit (INDEPENDENT_AMBULATORY_CARE_PROVIDER_SITE_OTHER): Payer: Medicare PPO

## 2021-09-23 DIAGNOSIS — Z23 Encounter for immunization: Secondary | ICD-10-CM

## 2021-11-11 ENCOUNTER — Ambulatory Visit: Payer: Medicare PPO | Admitting: Family Medicine

## 2021-11-12 ENCOUNTER — Other Ambulatory Visit: Payer: Self-pay

## 2021-11-12 DIAGNOSIS — F17290 Nicotine dependence, other tobacco product, uncomplicated: Secondary | ICD-10-CM

## 2021-11-14 ENCOUNTER — Ambulatory Visit (HOSPITAL_COMMUNITY)
Admission: RE | Admit: 2021-11-14 | Discharge: 2021-11-14 | Disposition: A | Discharge status: Home / Self Care | Payer: Medicare PPO | Source: Ambulatory Visit | Attending: Family Medicine | Admitting: Family Medicine

## 2021-11-14 DIAGNOSIS — Z136 Encounter for screening for cardiovascular disorders: Secondary | ICD-10-CM | POA: Insufficient documentation

## 2021-11-14 DIAGNOSIS — E669 Obesity, unspecified: Secondary | ICD-10-CM | POA: Insufficient documentation

## 2021-11-14 DIAGNOSIS — Z87891 Personal history of nicotine dependence: Secondary | ICD-10-CM | POA: Insufficient documentation

## 2021-11-14 DIAGNOSIS — E785 Hyperlipidemia, unspecified: Secondary | ICD-10-CM | POA: Insufficient documentation

## 2021-11-14 DIAGNOSIS — I10 Essential (primary) hypertension: Secondary | ICD-10-CM | POA: Diagnosis not present

## 2021-11-14 DIAGNOSIS — F17290 Nicotine dependence, other tobacco product, uncomplicated: Secondary | ICD-10-CM

## 2021-11-17 ENCOUNTER — Ambulatory Visit: Payer: Medicare PPO | Admitting: Family Medicine

## 2021-11-17 VITALS — BP 108/67 | HR 69 | Temp 98.3°F | Ht 67.0 in | Wt 228.2 lb

## 2021-11-17 DIAGNOSIS — M25552 Pain in left hip: Secondary | ICD-10-CM

## 2021-11-17 DIAGNOSIS — E875 Hyperkalemia: Secondary | ICD-10-CM | POA: Diagnosis not present

## 2021-11-17 DIAGNOSIS — I10 Essential (primary) hypertension: Secondary | ICD-10-CM

## 2021-11-17 DIAGNOSIS — M25551 Pain in right hip: Secondary | ICD-10-CM

## 2021-11-17 DIAGNOSIS — Z23 Encounter for immunization: Secondary | ICD-10-CM | POA: Diagnosis not present

## 2021-11-17 DIAGNOSIS — R739 Hyperglycemia, unspecified: Secondary | ICD-10-CM

## 2021-11-17 NOTE — Addendum Note (Signed)
Addended by: Augustina Mood on: 11/17/2021 04:28 PM   Modules accepted: Orders

## 2021-11-17 NOTE — Progress Notes (Signed)
Phone [phone] In person visit   Subjective:   Scott Neal is a 66 y.o. year old very pleasant male patient who presents for/with See problem oriented charting Chief Complaint  Patient presents with   Follow-up    6 month follow up wants to discuss issues with left hip.    Past Medical History-  Patient Active Problem List   Diagnosis Date Noted   Former smoker 03/06/2014    Priority: Low   Insomnia 03/06/2014    Priority: Low   Obstructive sleep apnea 05/28/2012    Priority: Low   Seasonal and perennial allergic rhinitis 06/21/2009    Priority: Low   Coronary artery calcification 08/22/2020    Priority: High   Ascending aortic aneurysm ([place]) 08/14/2020    Priority: High   Aortic atherosclerosis ([place]) 08/13/2020    Priority: Medium    Meniere disease, bilateral 07/24/2017    Priority: Medium    Hyperglycemia 02/04/2015    Priority: Medium    Essential hypertension 03/25/2007    Priority: Medium    Hyperlipidemia 07/29/2006    Priority: Medium    Obesity 08/22/2020    Priority: Low    Medications- reviewed and updated Current Outpatient Medications  Medication Sig Dispense Refill   aspirin EC 81 MG tablet Take 81 mg by mouth daily. Swallow whole.     losartan (COZAAR) 100 MG tablet Take 1 tablet (100 mg total) by mouth daily. 90 tablet 3   metoprolol succinate (TOPROL-XL) 25 MG 24 hr tablet TAKE 1/2 TABLET (12.'5MG'$ )  BY MOUTH DAILY 45 tablet 3   rosuvastatin (CRESTOR) 20 MG tablet Take 1 tablet (20 mg total) by mouth daily. 90 tablet 3   COVID-19 mRNA bivalent vaccine, Moderna, (MODERNA COVID-19 BIVAL BOOSTER) 50 MCG/0.5ML injection Inject into the muscle. 0.5 mL 0   No current facility-administered medications for this visit.     Objective:  BP 108/67 (BP Location: Left Arm, Patient Position: Sitting, Cuff Size: Large)   Pulse 69   Temp 98.3 F (36.8 C) (Temporal)   Ht '5\' 7"'$  (1.702 m)   Wt 228 lb 3.2 oz (103.5 kg)   SpO2 93%   BMI 35.74 kg/m  Gen:  NAD, resting comfortably CV: RRR no murmurs rubs or gallops Lungs: CTAB no crackles, wheeze, rhonchi Ext: no edema Normal hip range of motion without significant pain bilaterally.  Does have some pain with palpation over greater trochanteric bursa on the left but none on the right side.  Straight leg raise test is negative.  Exam consistent most likely with trochanteric bursitis.  Normal Stinchfield test.    Assessment and Plan   #Left lateral hip pain S: last visit took wallet out of pocket and was very helpful- had wanted to hold off on ortho referral at that time . Sometimes hurts on right but left is the main issues. Reports issue playing hockey years ago and stopped with that side placing more pressure. Once or twice a week throbs enough to wake him up. Pain can be up to 4/10. More common at night. Well over 6 months - no back pain with it.  A/P: suspect trochanteric bursitis on the left side at least based on exam (likely also more mild but similar on right) with ongoing pain and redflag symptom of waking up at night recommended referral to ortho or [facility]- he wanted to start with x-rays alone -offered PT as well- wants to hold for now and just do x-ray  #Ascending aortic aneurysm-follows with  Dr. Oval Linsey S: Initially noted on CT cardiac scoring.  Confirmed on CT coronary morphology.  Most recent echocardiogram 05/01/2021 at 43 mm with plan for 1 year follow-up A/P: has been stable- continue BP control- and repeat 1 year with DR. Oval Linsey -he is going to touch base to see if they want follow up now or next year   #hypertension S: compliant with losartan 100 mg daily. Metoprolol 12.5 mg XR -not checking at home BP Readings from Last 3 Encounters:  11/17/21 108/67  06/03/21 (!) 158/103  05/09/21 110/68  A/P: Controlled. Continue current medications.  -right at goal 105-120 for aortic aneurysm quinolone    #Nonobstructive CAD-CT coronary morphology with calcium score of 2033 at 97th  percentile for age on 09/04/2020-now follows with Dr. Oval Linsey #hyperlipidemia #Aortic atherosclerosis S: Medication: Rosuvastatin 20 mg, aspirin 81 mg No chest pain or shortness of breath (other than mild stable occasional shortness of breath) Lab Results  Component Value Date   CHOL 130 05/09/2021   HDL 53.90 05/09/2021   LDLCALC 63 05/09/2021   TRIG 68.0 05/09/2021   CHOLHDL 2 05/09/2021  A/P: Nonobstructive coronary disease remains asymptomatic.  Lipids goal is LDL 70 or less and at goal.  Similar role for aortic atherosclerosis which we presume is stable - For all the above continue current medications   # Hyperglycemia/insulin resistance/prediabetes-family history of diabetes With peak A1c of 5.9 S: Medication: None . Weight up some [place] and cut down on breakfast-  eats out and good size Lab Results  Component Value Date   HGBA1C 5.7 05/09/2021   HGBA1C 5.8 10/30/2020   HGBA1C 5.9 05/01/2020  A/P: hopefully stable- update a1c today. Discussed working on lifestyle changess  #prior rib fracture healed- no ongoing issues since may.  [facility] [place]-  a week ago no repeat injury  #stable Nocturia 1-2x a night  Recommended follow up: Return in about 6 months (around 05/18/2022) for physical or sooner if needed.Schedule b4 you leave. Future Appointments  Date Time Provider [place]  08/03/2022 11:45 AM [facility]    Lab/Order associations:   ICD-10-CM   1. Bilateral hip pain  M25.551 DG HIPS BILAT WITH PELVIS 3-4 VIEWS   M25.552     2. Hyperkalemia  E87.5 Comprehensive metabolic panel    3. Essential hypertension  I10 Comprehensive metabolic panel    4. Hyperglycemia  R73.9 HgB A1c      No orders of the defined types were placed in this encounter.   Return precautions advised.  Garret Reddish, MD

## 2021-11-17 NOTE — Patient Instructions (Addendum)
Prevnar 20 today please  Suspect greater trochanteric bursitis of hip- let us know if youc hange your mind and want to see PT, sports medicine or orthopedics  Please go to [facility] (updated 03/02/2019) - located [address] across the street from [place] - in the basement - Hours: 8:30-5:00 PM M-F (with lunch from 12:30- 1 PM). You do NOT need an appointment.   Please stop by lab before you go If you have mychart- we will send your results within 3 business days of [place] receiving them.  If you do not have mychart- we will call you about results within 5 business days of [place] receiving them.  *please also note that you will see labs on mychart as soon as they post. I will later go in and write notes on them- will say "notes from Dr. Yong Channel"   Recommended follow up: Return in about 6 months (around 05/18/2022) for physical or sooner if needed.Schedule b4 you leave.

## 2021-11-18 LAB — COMPREHENSIVE METABOLIC PANEL
ALT: 33 U/L (ref 0–53)
AST: 24 U/L (ref 0–37)
Albumin: 4.5 g/dL (ref 3.5–5.2)
Alkaline Phosphatase: 42 U/L (ref 39–117)
BUN: 20 mg/dL (ref 6–23)
CO2: 26 mEq/L (ref 19–32)
Calcium: 9.2 mg/dL (ref 8.4–10.5)
Chloride: 103 mEq/L (ref 96–112)
Creatinine, Ser: 1.11 mg/dL (ref 0.40–1.50)
GFR: 68.54 mL/min (ref >60.00)
Glucose, Bld: 85 mg/dL (ref 70–99)
Potassium: 4.6 mEq/L (ref 3.5–5.1)
Sodium: 135 mEq/L (ref 135–145)
Total Bilirubin: 0.5 mg/dL (ref 0.2–1.2)
Total Protein: 6.9 g/dL (ref 6.0–8.3)

## 2021-11-18 LAB — HEMOGLOBIN A1C: Hgb A1c MFr Bld: 6.1 % (ref 4.6–6.5)

## 2021-11-24 ENCOUNTER — Ambulatory Visit (INDEPENDENT_AMBULATORY_CARE_PROVIDER_SITE_OTHER)
Admission: RE | Admit: 2021-11-24 | Discharge: 2021-11-24 | Disposition: A | Discharge status: Home / Self Care | Payer: Medicare PPO | Source: Ambulatory Visit | Attending: Family Medicine | Admitting: Family Medicine

## 2021-11-24 DIAGNOSIS — M25551 Pain in right hip: Secondary | ICD-10-CM | POA: Diagnosis not present

## 2021-11-24 DIAGNOSIS — M25552 Pain in left hip: Secondary | ICD-10-CM

## 2022-05-14 ENCOUNTER — Other Ambulatory Visit: Payer: Self-pay | Admitting: Family Medicine

## 2022-05-28 DIAGNOSIS — M25561 Pain in right knee: Secondary | ICD-10-CM | POA: Diagnosis not present

## 2022-07-24 ENCOUNTER — Other Ambulatory Visit: Payer: Self-pay | Admitting: Family Medicine

## 2022-08-05 ENCOUNTER — Encounter (INDEPENDENT_AMBULATORY_CARE_PROVIDER_SITE_OTHER): Payer: Self-pay

## 2022-08-20 ENCOUNTER — Ambulatory Visit (INDEPENDENT_AMBULATORY_CARE_PROVIDER_SITE_OTHER): Payer: Medicare PPO

## 2022-08-20 VITALS — Wt 228.0 lb

## 2022-08-20 DIAGNOSIS — Z0000 Encounter for general adult medical examination without abnormal findings: Secondary | ICD-10-CM

## 2022-08-20 NOTE — Patient Instructions (Signed)
[name] , Thank you for taking time to come for your Medicare Wellness Visit. I appreciate your ongoing commitment to your health goals. Please review the following plan we discussed and let me know if I can assist you in the future.   Referrals/Orders/Follow-Ups/Clinician Recommendations: lose a little more weight   This is a list of the screening recommended for you and due dates:  Health Maintenance  Topic Date Due   COVID-19 Vaccine (5 - 2023-24 season) 11/26/2021   Medicare Annual Wellness Visit  07/25/2022   Flu Shot  08/06/2022   Colon Cancer Screening  09/21/2025   DTaP/Tdap/Td vaccine (3 - Td or Tdap) 06/09/2027   Pneumonia Vaccine  Completed   Hepatitis C Screening  Completed   Zoster (Shingles) Vaccine  Completed   HPV Vaccine  Aged Out    Advanced directives: (Copy Requested) Please bring a copy of your health care power of attorney and living will to the office to be added to your chart at your convenience.  Next Medicare Annual Wellness Visit scheduled for next year: Yes  Preventive Care 5 Years and Older, Male  Preventive care refers to lifestyle choices and visits with your health care provider that can promote health and wellness. What does preventive care include? A yearly physical exam. This is also called an annual well check. Dental exams once or twice a year. Routine eye exams. Ask your health care provider how often you should have your eyes checked. Personal lifestyle choices, including: Daily care of your teeth and gums. Regular physical activity. Eating a healthy diet. Avoiding tobacco and drug use. Limiting alcohol use. Practicing safe sex. Taking low doses of aspirin every day. Taking vitamin and mineral supplements as recommended by your health care provider. What happens during an annual well check? The services and screenings done by your health care provider during your annual well check will depend on your age, overall health, lifestyle  risk factors, and family history of disease. Counseling  Your health care provider may ask you questions about your: Alcohol use. Tobacco use. Drug use. Emotional well-being. Home and relationship well-being. Sexual activity. Eating habits. History of falls. Memory and ability to understand (cognition). Work and work Astronomer. Screening  You may have the following tests or measurements: Height, weight, and BMI. Blood pressure. Lipid and cholesterol levels. These may be checked every 5 years, or more frequently if you are over 24 years old. Skin check. Lung cancer screening. You may have this screening every year starting at age 44 if you have a 30-pack-year history of smoking and currently smoke or have quit within the past 15 years. Fecal occult blood test (FOBT) of the stool. You may have this test every year starting at age 45. Flexible sigmoidoscopy or colonoscopy. You may have a sigmoidoscopy every 5 years or a colonoscopy every 10 years starting at age 22. Prostate cancer screening. Recommendations will vary depending on your family history and other risks. Hepatitis C blood test. Hepatitis B blood test. Sexually transmitted disease (STD) testing. Diabetes screening. This is done by checking your blood sugar (glucose) after you have not eaten for a while (fasting). You may have this done every 1-3 years. Abdominal aortic aneurysm (AAA) screening. You may need this if you are a current or former smoker. Osteoporosis. You may be screened starting at age 66 if you are at high risk. Talk with your health care provider about your test results, treatment options, and if necessary, the need for more tests.  Vaccines  Your health care provider may recommend certain vaccines, such as: Influenza vaccine. This is recommended every year. Tetanus, diphtheria, and acellular pertussis (Tdap, Td) vaccine. You may need a Td booster every 10 years. Zoster vaccine. You may need this after age  55. Pneumococcal 13-valent conjugate (PCV13) vaccine. One dose is recommended after age 65. Pneumococcal polysaccharide (PPSV23) vaccine. One dose is recommended after age 58. Talk to your health care provider about which screenings and vaccines you need and how often you need them. This information is not intended to replace advice given to you by your health care provider. Make sure you discuss any questions you have with your health care provider. Document Released: 01/18/2015 Document Revised: 09/11/2015 Document Reviewed: 10/23/2014 Elsevier Interactive Patient Education  2017 [employer].  Fall Prevention in the Home Falls can cause injuries. They can happen to people of all ages. There are many things you can do to make your home safe and to help prevent falls. What can I do on the outside of my home? Regularly fix the edges of walkways and driveways and fix any cracks. Remove anything that might make you trip as you walk through a door, such as a raised step or threshold. Trim any bushes or trees on the path to your home. Use bright outdoor lighting. Clear any walking paths of anything that might make someone trip, such as rocks or tools. Regularly check to see if handrails are loose or broken. Make sure that both sides of any steps have handrails. Any raised decks and porches should have guardrails on the edges. Have any leaves, snow, or ice cleared regularly. Use sand or salt on walking paths during winter. Clean up any spills in your garage right away. This includes oil or grease spills. What can I do in the bathroom? Use night lights. Install grab bars by the toilet and in the tub and shower. Do not use towel bars as grab bars. Use non-skid mats or decals in the tub or shower. If you need to sit down in the shower, use a plastic, non-slip stool. Keep the floor dry. Clean up any water that spills on the floor as soon as it happens. Remove soap buildup in the tub or shower  regularly. Attach bath mats securely with double-sided non-slip rug tape. Do not have throw rugs and other things on the floor that can make you trip. What can I do in the bedroom? Use night lights. Make sure that you have a light by your bed that is easy to reach. Do not use any sheets or blankets that are too big for your bed. They should not hang down onto the floor. Have a firm chair that has side arms. You can use this for support while you get dressed. Do not have throw rugs and other things on the floor that can make you trip. What can I do in the kitchen? Clean up any spills right away. Avoid walking on wet floors. Keep items that you use a lot in easy-to-reach places. If you need to reach something above you, use a strong step stool that has a grab bar. Keep electrical cords out of the way. Do not use floor polish or wax that makes floors slippery. If you must use wax, use non-skid floor wax. Do not have throw rugs and other things on the floor that can make you trip. What can I do with my stairs? Do not leave any items on the stairs. Make sure that  there are handrails on both sides of the stairs and use them. Fix handrails that are broken or loose. Make sure that handrails are as long as the stairways. Check any carpeting to make sure that it is firmly attached to the stairs. Fix any carpet that is loose or worn. Avoid having throw rugs at the top or bottom of the stairs. If you do have throw rugs, attach them to the floor with carpet tape. Make sure that you have a light switch at the top of the stairs and the bottom of the stairs. If you do not have them, ask someone to add them for you. What else can I do to help prevent falls? Wear shoes that: Do not have high heels. Have rubber bottoms. Are comfortable and fit you well. Are closed at the toe. Do not wear sandals. If you use a stepladder: Make sure that it is fully opened. Do not climb a closed stepladder. Make sure that  both sides of the stepladder are locked into place. Ask someone to hold it for you, if possible. Clearly mark and make sure that you can see: Any grab bars or handrails. First and last steps. Where the edge of each step is. Use tools that help you move around (mobility aids) if they are needed. These include: Canes. Walkers. Scooters. Crutches. Turn on the lights when you go into a dark area. Replace any light bulbs as soon as they burn out. Set up your furniture so you have a clear path. Avoid moving your furniture around. If any of your floors are uneven, fix them. If there are any pets around you, be aware of where they are. Review your medicines with your doctor. Some medicines can make you feel dizzy. This can increase your chance of falling. Ask your doctor what other things that you can do to help prevent falls. This information is not intended to replace advice given to you by your health care provider. Make sure you discuss any questions you have with your health care provider. Document Released: 10/18/2008 Document Revised: 05/30/2015 Document Reviewed: 01/26/2014 Elsevier Interactive Patient Education  2017 [employer].

## 2022-08-20 NOTE — Progress Notes (Signed)
Subjective:   Scott Neal is a 67 y.o. male who presents for Medicare Annual/Subsequent preventive examination.  Visit Complete: Virtual  I connected with  Scott Neal on 08/20/22 by a audio enabled telemedicine application and verified that I am speaking with the correct person using two identifiers.  Patient Location: Home  Provider Location: Office/Clinic  I discussed the limitations of evaluation and management by telemedicine. The patient expressed understanding and agreed to proceed.   Review of Systems      Cardiac Risk Factors include: advanced age (>36men, >65 women);dyslipidemia;hypertension;male gender;obesity (BMI >30kg/m2)     Objective:    Today's Vitals   08/20/22 1516  Weight: 228 lb (103.4 kg)   Body mass index is 35.71 kg/m.     08/20/2022    3:19 PM 07/24/2021   10:21 AM 06/03/2021    9:30 AM 05/28/2015    1:51 PM  Advanced Directives  Does Patient Have a Medical Advance Directive? Yes Yes No Yes  Type of Estate agent of [place];Living will Healthcare Power of Attorney  Living will  Copy of Healthcare Power of Attorney in Chart? No - copy requested No - copy requested    Would patient like information on creating a medical advance directive?   No - Patient declined     Current Medications (verified) Outpatient Encounter Medications as of 08/20/2022  Medication Sig   aspirin EC 81 MG tablet Take 81 mg by mouth daily. Swallow whole.   losartan (COZAAR) 100 MG tablet TAKE ONE TABLET BY MOUTH DAILY   metoprolol succinate (TOPROL-XL) 25 MG 24 hr tablet TAKE 1/2 TABLET BY MOUTH DAILY   rosuvastatin (CRESTOR) 20 MG tablet TAKE ONE TABLET BY MOUTH DAILY   Zoster Vaccine Live, PF, (ZOSTAVAX) 74259 UNT/0.65ML injection Inject into the skin.   No facility-administered encounter medications on file as of 08/20/2022.    Allergies (verified) Patient has no known allergies.   History: Past Medical History:  Diagnosis Date    Allergy    Cataract    removed both eyes   Coronary artery calcification 08/22/2020   Hyperlipidemia    currently under ontrol    Hypertension    Obesity 08/22/2020   Sleep apnea    no cpap    Past Surgical History:  Procedure Laterality Date   APPENDECTOMY     cataract lens implants     bilateral   COLONOSCOPY     x2 last 2010 1 HPP    DENTAL SURGERY     SINUS IRRIGATION  2003   Family History  Problem Relation Age of Onset   GER disease Mother    Breast cancer Mother    Stroke Mother    Hypertension Father    Heart attack Father        56s, heavy smoker   Hypertension Brother    Diabetes Brother        x2   Hypertension Brother        x2   Heart disease Paternal Grandmother    Nephrolithiasis Daughter    Colon polyps Neg Hx    Colon cancer Neg Hx    Esophageal cancer Neg Hx    Rectal cancer Neg Hx    Stomach cancer Neg Hx    Social History   Socioeconomic History   Marital status: Married    Spouse name: Not on file   Number of children: Not on file   Years of education: Not on file  Highest education level: Not on file  Occupational History   Not on file  Tobacco Use   Smoking status: Some Days    Current packs/day: 0.00    Average packs/day: 0.8 packs/day for 4.0 years (3.0 ttl pk-yrs)    Types: Cigarettes, Cigars    Start date: 03/11/1993    Last attempt to quit: 03/11/1997    Years since quitting: 25.4   Smokeless tobacco: Never  Vaping Use   Vaping status: Never Used  Substance and Sexual Activity   Alcohol use: Yes    Alcohol/week: 10.0 standard drinks of alcohol    Types: 10 Standard drinks or equivalent per week   Drug use: No   Sexual activity: Yes  Other Topics Concern   Not on file  Social History Narrative   Married (wife patient outside practice) 3 children, 1 step child. 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       Hobbies: formerly played hockey-wants to get back in, golf, plays music   Social Determinants of  Health   Financial Resource Strain: Low Risk  (08/20/2022)   Overall Financial Resource Strain (CARDIA)    Difficulty of Paying Living Expenses: Not hard at all  Food Insecurity: No Food Insecurity (08/20/2022)   Hunger Vital Sign    Worried About Running Out of Food in the Last Year: Never true    Ran Out of Food in the Last Year: Never true  Transportation Needs: No Transportation Needs (08/20/2022)   PRAPARE - Administrator, Civil Service (Medical): No    Lack of Transportation (Non-Medical): No  Physical Activity: Insufficiently Active (08/20/2022)   Exercise Vital Sign    Days of Exercise per Week: 1 day    Minutes of Exercise per Session: 60 min  Stress: No Stress Concern Present (08/20/2022)   [employer] of Occupational Health - Occupational Stress Questionnaire    Feeling of Stress : Not at all  Social Connections: Socially Integrated (08/20/2022)   Social Connection and Isolation Panel [NHANES]    Frequency of Communication with Friends and Family: More than three times a week    Frequency of Social Gatherings with Friends and Family: More than three times a week    Attends Religious Services: More than 4 times per year    Active Member of [employer] or Organizations: Yes    Attends Banker Meetings: 1 to 4 times per year    Marital Status: Married    Tobacco Counseling Ready to quit: Not Answered Counseling given: Not Answered   Clinical Intake:  Pre-visit preparation completed: Yes  Pain : No/denies pain     BMI - recorded: 35.71 Nutritional Status: BMI > 30  Obese Nutritional Risks: None Diabetes: No  How often do you need to have someone help you when you read instructions, pamphlets, or other written materials from your doctor or pharmacy?: 1 - Never  Interpreter Needed?: No  Information entered by :: Lanier Ensign, LPN   Activities of Daily Living    08/20/2022    3:17 PM  In your present state of health, do you have any  difficulty performing the following activities:  Hearing? 0  Vision? 0  Difficulty concentrating or making decisions? 0  Walking or climbing stairs? 0  Dressing or bathing? 0  Doing errands, shopping? 0  Preparing Food and eating ? N  Using the Toilet? N  In the past six months, have you accidently leaked  urine? N  Do you have problems with loss of bowel control? N  Managing your Medications? N  Managing your Finances? N  Housekeeping or managing your Housekeeping? N    Patient Care Team: Shelva Majestic, MD as PCP - General (Family Medicine) Janalyn Harder, MD (Inactive) as Consulting Physician (Dermatology)  Indicate any recent Medical Services you may have received from other than [facility] providers in the past year (date may be approximate).     Assessment:   This is a routine wellness examination for [place].  Hearing/Vision screen Hearing Screening - Comments:: Pt denies any hearing issues  Vision Screening - Comments:: Pt follows up with [facility] for annual eye exams  Dietary issues and exercise activities discussed:     Goals Addressed             This Visit's Progress    Patient Stated       Lose a little more weight        Depression Screen    08/20/2022    3:19 PM 11/17/2021    3:11 PM 07/24/2021   10:20 AM 10/30/2020    8:51 AM 05/09/2020   11:13 AM 10/18/2019   11:48 AM 02/16/2019    8:48 AM  PHQ 2/9 Scores  PHQ - 2 Score 0 0 0 0 0 0 0  PHQ- 9 Score     0  0    Fall Risk    08/20/2022    3:22 PM 11/17/2021    3:10 PM 07/24/2021   10:22 AM 10/30/2020    8:51 AM 10/18/2019   11:48 AM  Fall Risk   Falls in the past year? 0 1 1 0 0  Number falls in past yr: 0 0 1 0   Injury with Fall? 0 1 1 0   Comment   snorkeling    Risk for fall due to : Impaired vision  Impaired vision    Follow up Falls prevention discussed  Falls prevention discussed Falls evaluation completed     MEDICARE RISK AT HOME:  Medicare Risk at Home - 08/20/22  1522     Any stairs in or around the home? Yes    If so, are there any without handrails? No    Home free of loose throw rugs in walkways, pet beds, electrical cords, etc? Yes    Adequate lighting in your home to reduce risk of falls? Yes    Life alert? No    Use of a cane, walker or w/c? No    Grab bars in the bathroom? Yes    Shower chair or bench in shower? No    Elevated toilet seat or a handicapped toilet? No             TIMED UP AND GO:  Was the test performed?  No    Cognitive Function:        08/20/2022    3:23 PM  6CIT Screen  What Year? 0 points  What month? 0 points  What time? 0 points  Count back from 20 0 points  Months in reverse 0 points  Repeat phrase 0 points  Total Score 0 points    Immunizations Immunization History  Administered Date(s) Administered   Fluad Quad(high Dose 65+) 10/17/2019, 10/01/2020, 09/23/2021   Influenza Split 11/04/2010, 11/04/2011   Influenza Whole 12/07/2006, 11/05/2008, 10/17/2009   Influenza,inj,Quad PF,6+ Mos 11/30/2012, 09/30/2016, 10/30/2017, 10/08/2018   Influenza-Unspecified 12/16/2013, 10/02/2014, 09/12/2015, 10/30/2017   Moderna Covid-19 Vaccine Bivalent  Booster 37yrs & up 12/16/2020, 05/15/2021   Moderna SARS-COV2 Booster Vaccination 11/19/2019, 05/17/2020, 10/01/2021   Moderna Sars-Covid-2 Vaccination 02/10/2019, 03/11/2019   PNEUMOCOCCAL CONJUGATE-20 11/17/2021   Pneumococcal Conjugate-13 06/04/2020   Td 06/06/2006   Tdap 06/08/2017   Zoster Recombinant(Shingrix) 08/18/2018, 11/15/2018    TDAP status: Up to date  Flu Vaccine status: Due, Education has been provided regarding the importance of this vaccine. Advised may receive this vaccine at local pharmacy or Health Dept. Aware to provide a copy of the vaccination record if obtained from local pharmacy or Health Dept. Verbalized acceptance and understanding.  Pneumococcal vaccine status: Up to date  Covid-19 vaccine status: Information provided on how  to obtain vaccines.   Qualifies for Shingles Vaccine? Yes   Zostavax completed Yes   Shingrix Completed?: Yes  Screening Tests Health Maintenance  Topic Date Due   COVID-19 Vaccine (5 - 2023-24 season) 11/26/2021   INFLUENZA VACCINE  08/06/2022   Medicare Annual Wellness (AWV)  08/20/2023   Colonoscopy  09/21/2025   DTaP/Tdap/Td (3 - Td or Tdap) 06/09/2027   Pneumonia Vaccine 31+ Years old  Completed   Hepatitis C Screening  Completed   Zoster Vaccines- Shingrix  Completed   HPV VACCINES  Aged Out    Health Maintenance  Health Maintenance Due  Topic Date Due   COVID-19 Vaccine (5 - 2023-24 season) 11/26/2021   INFLUENZA VACCINE  08/06/2022    Colorectal cancer screening: Type of screening: Colonoscopy. Completed 09/22/18. Repeat every 7 years   Additional Screening:  Hepatitis C Screening:  Completed 02/27/15  Vision Screening: Recommended annual ophthalmology exams for early detection of glaucoma and other disorders of the eye. Is the patient up to date with their annual eye exam?  Yes  Who is the provider or what is the name of the office in which the patient attends annual eye exams? [facility]  If pt is not established with a provider, would they like to be referred to a provider to establish care? No .   Dental Screening: Recommended annual dental exams for proper oral hygiene    Community Resource Referral / Chronic Care Management: CRR required this visit?  No   CCM required this visit?  No     Plan:     I have personally reviewed and noted the following in the patient's chart:   Medical and social history Use of alcohol, tobacco or illicit drugs  Current medications and supplements including opioid prescriptions. Patient is not currently taking opioid prescriptions. Functional ability and status Nutritional status Physical activity Advanced directives List of other physicians Hospitalizations, surgeries, and ER visits in previous 12  months Vitals Screenings to include cognitive, depression, and falls Referrals and appointments  In addition, I have reviewed and discussed with patient certain preventive protocols, quality metrics, and best practice recommendations. A written personalized care plan for preventive services as well as general preventive health recommendations were provided to patient.     Marzella Schlein, LPN   1/61/0960   After Visit Summary: (MyChart) Due to this being a telephonic visit, the after visit summary with patients personalized plan was offered to patient via MyChart   Nurse Notes: none

## 2022-10-22 ENCOUNTER — Other Ambulatory Visit (HOSPITAL_BASED_OUTPATIENT_CLINIC_OR_DEPARTMENT_OTHER): Payer: Self-pay

## 2022-10-22 MED ORDER — COMIRNATY 30 MCG/0.3ML IM SUSY
0.3000 mL | PREFILLED_SYRINGE | Freq: Once | INTRAMUSCULAR | 0 refills | Status: AC
  Filled 2022-10-22: qty 0.3, 1d supply, fill #0

## 2022-12-10 ENCOUNTER — Ambulatory Visit: Payer: Medicare PPO | Admitting: Internal Medicine

## 2022-12-10 ENCOUNTER — Encounter: Payer: Self-pay | Admitting: Internal Medicine

## 2022-12-10 VITALS — BP 114/82 | HR 77 | Temp 98.2°F | Ht 67.0 in | Wt 228.0 lb

## 2022-12-10 DIAGNOSIS — J069 Acute upper respiratory infection, unspecified: Secondary | ICD-10-CM

## 2022-12-10 DIAGNOSIS — J029 Acute pharyngitis, unspecified: Secondary | ICD-10-CM | POA: Diagnosis not present

## 2022-12-10 DIAGNOSIS — J324 Chronic pansinusitis: Secondary | ICD-10-CM | POA: Diagnosis not present

## 2022-12-10 DIAGNOSIS — I10 Essential (primary) hypertension: Secondary | ICD-10-CM

## 2022-12-10 DIAGNOSIS — J020 Streptococcal pharyngitis: Secondary | ICD-10-CM

## 2022-12-10 LAB — POCT RAPID STREP A (OFFICE): Rapid Strep A Screen: POSITIVE — AB

## 2022-12-10 MED ORDER — AMOXICILLIN-POT CLAVULANATE 875-125 MG PO TABS: 1.0000 | ORAL_TABLET | Freq: Two times a day (BID) | ORAL | 0 refills | Status: DC

## 2022-12-10 MED ORDER — FLUTICASONE PROPIONATE 50 MCG/ACT NA SUSP: 2.0000 | Freq: Every day | NASAL | 6 refills | Status: DC

## 2022-12-10 MED ORDER — LORATADINE 10 MG PO TABS: 10.0000 mg | ORAL_TABLET | Freq: Every day | ORAL | 11 refills | Status: AC

## 2022-12-10 MED ORDER — SIMPLY SALINE 0.9 % NA AERS
2.0000 | INHALATION_SPRAY | NASAL | 11 refills | Status: AC
Start: 2022-12-10 — End: ?

## 2022-12-10 NOTE — Assessment & Plan Note (Signed)
Hypertension (I10) - Stable Assessment: Blood pressure well-controlled on current regimen at 114/82. No signs of fluid overload. Plan:  Continue current medications:  Losartan 100mg  daily Metoprolol succinate 12.5mg  daily   Avoid decongestants due to potential BP effects

## 2022-12-10 NOTE — Patient Instructions (Addendum)
It was a pleasure seeing you today! Your health and satisfaction are our top priorities.   Glenetta Hew, MD  After Visit Summary  Today's Diagnosis: - Upper respiratory infection - Chronic sinus condition - ? Streptococcus Infection  Instructions for Home Care:  1. Nasal Care:    - Use Simply Saline nasal spray 2-3 times daily    - After rinsing nose, blow gently to clear    - Follow saline rinse with Flonase spray if congestion persists    - Best time for nasal care is before bedtime  2. Medications:    - Continue Dayquil as needed for symptom relief    - Keep taking your regular medications for blood pressure and cholesterol    - Avoid decongestant pills or sprays as they may affect your blood pressure  3. Comfort Measures:    - Stay well hydrated    - Use ice chips, popsicles, or cold drinks for sore throat    - Rest as needed, especially when feeling short of breath    - Sleep with head slightly elevated to help drainage  Warning Signs - Seek Medical Care If: - Fever develops or persists - Breathing becomes significantly more difficult - Symptoms worsen after initial improvement - Illness lasts longer than 10 days without improvement - Cough becomes severe or prevents sleep - Chest pain develops  Prevention Tips: - Continue daily nasal rinses even after feeling better - Use Flonase only on days with allergy symptoms - Wash hands frequently - Stay up to date with recommended vaccines  Follow-up Care: - No scheduled follow-up needed - Contact office if symptoms worsen or persist beyond 10 days - Continue routine care for blood pressure and other chronic conditions  Office Contact Information: Call if you have questions or concerns  Next Steps:  [x]  Flexible Follow-Up: We recommend a follow up with your primary care, a specialist, or me within 1-2 weeks for ensuring your problem resolves. This allows for progress monitoring and treatment adjustments. [x]  Early  Intervention: Schedule sooner appointment, call our on-call services, or go to emergency room if there is Increase in pain or discomfort New or worsening symptoms Sudden or severe changes in your health [x]  Lab & X-ray Appointments: complete or schedule to complete today, or call to schedule.  X-rays: [facility] (M-F, 8:30am-noon or 1pm-5pm).  Making the Most of Our Focused (20 minute) Follow Up Appointments:  [x]   Clearly state your top concerns at the beginning of the visit to focus our discussion [x]   If you anticipate you will need more time, please inform the front desk during scheduling - we can book multiple appointments in the same week. [x]   If you have transportation problems- use our convenient video appointments or ask about transportation support. [x]   We can get down to business faster if you use MyChart to update information before the visit and submit non-urgent questions before your visit. Thank you for taking the time to provide details through MyChart.  Let our nurse know and she can import this information into your encounter documents.  Arrival and Wait Times: [x]   Arriving on time ensures that everyone receives prompt attention. [x]   Early morning (8a) and afternoon (1p) appointments tend to have shortest wait times. [x]   Unfortunately, we cannot delay appointments for late arrivals or hold slots during phone calls.  Getting Answers and Following Up  [x]   Simple Questions & Concerns: For quick questions or basic follow-up after your  visit, reach [place] at [phone] or MyChart messaging. [x]   Complex Concerns: If your concern is more complex, scheduling an appointment might be best. Discuss this with the staff to find the most suitable option. [x]   Lab & Imaging Results: We'll contact you directly if results are abnormal or you don't use MyChart. Most normal results will be on MyChart within 2-3 business days, with a review message from Dr. Jon Billings.  Haven't heard back in 2 weeks? Need results sooner? Contact us at [phone]. [x]   Referrals: Our referral coordinator will manage specialist referrals. The specialist's office should contact you within 2 weeks to schedule an appointment. Call us if you haven't heard from them after 2 weeks.  Staying Connected  [x]   MyChart: Activate your MyChart for the fastest way to access results and message [place]. See the last page of this paperwork for instructions on how to activate.  Bring to Your Next Appointment  [x]   Medications: Please bring all your medication bottles to your next appointment to ensure we have an accurate record of your prescriptions. [x]   Health Diaries: If you're monitoring any health conditions at home, keeping a diary of your readings can be very helpful for discussions at your next appointment.  Billing  [x]   X-ray & Lab Orders: These are billed by separate companies. Contact the invoicing company directly for questions or concerns. [x]   Visit Charges: Discuss any billing inquiries with our administrative services team.  Your Satisfaction Matters  [x]   Share Your Experience: We strive for your satisfaction! If you have any complaints, or preferably compliments, please let Dr. Jon Billings know directly or contact our Practice Administrators, Edwena Felty or [employer], by asking at the front desk.   Reviewing Your Records  [x]   Review this early draft of your clinical encounter notes below and the final encounter summary tomorrow on MyChart after its been completed.   Chronic pansinusitis -     Fluticasone Propionate; Place 2 sprays into both nostrils daily.  Dispense: 16 g; Refill: 6 -     Simply Saline; Place 2 each into the nose as directed. Use nightly for sinus hygiene long-term.  Can also be used as many times daily as desired to assist with clearing congested sinuses.  Dispense: 127 mL; Refill: 11 -     Loratadine; Take 1 tablet (10 mg total) by mouth daily.   Dispense: 30 tablet; Refill: 11 -     Amoxicillin-Pot Clavulanate; Take 1 tablet by mouth 2 (two) times daily.  Dispense: 20 tablet; Refill: 0  Sore throat -     POCT rapid strep A  Strep pharyngitis  Acute URI  Essential hypertension     VISIT SUMMARY:  During today's visit, we discussed your recent symptoms of a productive cough, sore throat, and significant nasal congestion, which started after your trip to [place]. We also reviewed your history of chronic sinus issues and your current medications. You have been managing your symptoms with over-the-counter medications and have received recent vaccinations for flu and COVID-19.  YOUR PLAN:  -UPPER RESPIRATORY INFECTION: An upper respiratory infection is a condition that affects the nose, throat, and airways, often caused by viruses or bacteria. We will perform a strep test to rule out strep throat. In the meantime, continue using Dayquil as needed, use Simply Saline nasal spray 2-3 times daily, and consider Flonase nasal spray after sinus rinses if congestion persists. If symptoms worsen or do not improve, we may consider antibiotics.  -CHRONIC  SINUSITIS: Chronic sinusitis is a long-term inflammation of the sinuses that can cause congestion and other symptoms. To manage this, we recommend a daily sinus rinse and using Flonase nasal spray on days with significant sneezing or allergy symptoms.  -GENERAL HEALTH MAINTENANCE: You are up-to-date with your flu and COVID-19 vaccinations. Continue taking your current medications, including rosuvastatin 20 mg and baby aspirin. We encourage you to get annual flu vaccinations and stay current with COVID-19 vaccinations.  INSTRUCTIONS:  Please follow up if your symptoms persist or worsen. We will provide you with a printout of the discussed treatment plan and recommendations.

## 2022-12-10 NOTE — Progress Notes (Signed)
[facility]: [phone]   -- Medical Office Visit --  Patient:  Scott Neal      Age: 67 y.o.       Sex:  male  Date:   12/10/2022 Today's Healthcare Provider: Lula Olszewski, MD  ==========================================================================    Assessment & Plan Chronic pansinusitis Chronic Sinusitis (J32.9) Assessment: Long-standing history of sinus issues and mouth breathing, likely contributing to current symptoms. Reports frequent allergy symptoms including sneezing. Plan:  Daily saline nasal rinse recommended for ongoing maintenance Flonase advised for days with significant allergy symptoms Patient educated on proper nasal hygiene techniques Avoid decongestant nasal sprays due to risk of rebound congestion Sore throat  Strep pharyngitis  Acute URI Acute Upper Respiratory Infection (J06.9) Assessment: Presents with productive cough, severe nasal congestion, and sore throat following recent travel. Physical exam shows clear lungs, no lymphadenopathy, and normal appearing throat. Home COVID test negative. Clinical picture most consistent with viral URI, though bacterial sinusitis remains in differential given productive cough. Symptoms moderately impacting quality of life but no severe systemic symptoms. Plan:  Strep test performed today Simply Saline nasal spray 2-3 times daily Continue Dayquil as needed for symptom relief After nasal rinse, may use Flonase nasal spray for persistent congestion Patient educated about typical viral course and expected duration Return if symptoms worsen or persist beyond 10 days Essential hypertension Hypertension (I10) - Stable Assessment: Blood pressure well-controlled on current regimen at 114/82. No signs of fluid overload. Plan:  Continue current medications:  Losartan 100mg  daily Metoprolol succinate 12.5mg  daily   Avoid decongestants due to potential BP effects     Orders  Placed During this Encounter:   Diagnoses and all orders for this visit: Chronic pansinusitis -     fluticasone (FLONASE) 50 MCG/ACT nasal spray; Place 2 sprays into both nostrils daily. -     Saline (SIMPLY SALINE) 0.9 % AERS; Place 2 each into the nose as directed. Use nightly for sinus hygiene long-term.  Can also be used as many times daily as desired to assist with clearing congested sinuses. -     loratadine (CLARITIN) 10 MG tablet; Take 1 tablet (10 mg total) by mouth daily. -     amoxicillin-clavulanate (AUGMENTIN) 875-125 MG tablet; Take 1 tablet by mouth 2 (two) times daily. Sore throat -     POCT rapid strep A  Preventive Care:  Up to date on influenza and COVID vaccinations Continue aspirin 81mg  daily and rosuvastatin 20mg  daily  Follow-up:  Return for worsening symptoms or if not improved in 10 days Continue routine follow-up with PCP for chronic conditions Future Appointments  Date Time Provider Department Center  06/02/2023 11:20 AM Shelva Majestic, MD [facility]  08/23/2023  2:30 PM [facility] ANNUAL WELLNESS VISIT 1 [facility]  Patient Care Team: Shelva Majestic, MD as PCP - General (Family Medicine) Janalyn Harder, MD (Inactive) as Consulting Physician (Dermatology)    SUBJECTIVE: 67 y.o. male who has Hyperlipidemia; Essential hypertension; Seasonal and perennial allergic rhinitis; Obstructive sleep apnea; Former smoker; Insomnia; Hyperglycemia; Meniere disease, bilateral; Aortic atherosclerosis (HCC); Ascending aortic aneurysm (HCC); Coronary artery calcification; and Obesity on their problem list.  Main reasons for visit/main concerns/chief complaint: Cough, Nasal Congestion, and Sore Throat    AI-Extracted: Discussed the use of AI scribe software for clinical note transcription with the patient, who gave verbal consent to proceed.  Subjective: Patient presents with productive cough, severe nasal congestion, and sore throat following recent travel to  [place]. Reports significant nasal discharge described as [address]" though somewhat improved today. Cough is productive with yellowish-green sputum, accompanied by chest discomfort and abdominal pain from coughing. Patient notes a distinctive "porpoise sound" when exhaling, particularly noticeable yesterday. Denies significant shortness of breath at rest but notes mild dyspnea with stair climbing. Timeline: Symptoms began after returning from [place] on Tuesday. Started self-treating with Dayquil on Monday/Tuesday of this week. Performed home COVID test yesterday which was negative (test within expiration date). Prior Treatment: Using Dayquil during the day with some benefit. Has tried gargling with Listerine which provided mild relief of sore throat symptoms. Pertinent History: Reports history of pneumonia within the past year. Notes chronic sinus issues with tendency toward mouth breathing. Denies history of other serious respiratory infections. Recently received flu vaccine and updated COVID vaccine approximately six weeks ago. Review of Systems:  Respiratory: Productive cough, mild dyspnea with exertion ENT: Severe nasal congestion, significant sore throat Constitutional: No fever reported Cardiovascular: Denies leg edema Musculoskeletal: Reports unrelated ankle pain from walking 5 miles recently, improved with supportive footwear  Current Medications:  Aspirin EC 81mg  daily Losartan 100mg  daily Metoprolol succinate 12.5mg  daily Rosuvastatin 20mg  daily Adherent with all prescribed medications.  Allergies/Immunizations: Up to date on COVID and influenza vaccines. Received Zoster vaccine in 2017.   Note that patient  has a past medical history of Allergy, Cataract, Coronary artery calcification (08/22/2020), Hyperlipidemia, Hypertension, Obesity (08/22/2020), and Sleep apnea.  Problem list overviews that were updated at today's visit:No problems updated.  Med reconciliation: Current  Outpatient Medications on File Prior to Visit  Medication Sig   aspirin EC 81 MG tablet Take 81 mg by mouth daily. Swallow whole.   losartan (COZAAR) 100 MG tablet TAKE ONE TABLET BY MOUTH DAILY   metoprolol succinate (TOPROL-XL) 25 MG 24 hr tablet TAKE 1/2 TABLET BY MOUTH DAILY   rosuvastatin (CRESTOR) 20 MG tablet TAKE ONE TABLET BY MOUTH DAILY   Zoster Vaccine Live, PF, (ZOSTAVAX) 96295 UNT/0.65ML injection Inject into the skin.   No current facility-administered medications on file prior to visit.  There are no discontinued medications.   Objective   Physical Exam     12/10/2022   10:02 AM 08/20/2022    3:16 PM 11/17/2021    3:11 PM  Vitals with BMI  Height 5\' 7"   5\' 7"   Weight 228 lbs 228 lbs 228 lbs 3 oz  BMI 35.7  35.73  Systolic 114  108  Diastolic 82  67  Pulse 77  69   Wt Readings from Last 10 Encounters:  12/10/22 228 lb (103.4 kg)  08/20/22 228 lb (103.4 kg)  11/17/21 228 lb 3.2 oz (103.5 kg)  06/03/21 215 lb (97.5 kg)  05/09/21 217 lb 6.4 oz (98.6 kg)  10/31/20 217 lb 4.8 oz (98.6 kg)  10/30/20 214 lb 9.6 oz (97.3 kg)  08/22/20 216 lb 9.6 oz (98.2 kg)  05/09/20 220 lb (99.8 kg)  05/01/20 222 lb 6.4 oz (100.9 kg)   Vital signs reviewed.  Nursing notes reviewed. Weight trend reviewed. Abnormalities and Problem-Specific physical exam findings:  lungs clear, tonsils not swollen, sniffles regular, no cervical lymphadenopathy   General Appearance:  No acute distress appreciable.   Well-groomed, healthy-appearing male.  Well proportioned with no abnormal fat distribution.  Good muscle tone. Pulmonary:  Normal work of breathing at rest, no respiratory distress apparent. SpO2: 95 %  Musculoskeletal: All extremities are intact.  Neurological:  Awake, alert, oriented, and engaged.  No  obvious focal neurological deficits or cognitive impairments.  Sensorium seems unclouded.   Speech is clear and coherent with logical content. Psychiatric:  Appropriate mood, pleasant and  cooperative demeanor, thoughtful and engaged during the exam  Results            Results for orders placed or performed in visit on 12/10/22  POCT rapid strep A  Result Value Ref Range   Rapid Strep A Screen Positive (A) Negative    Office Visit on 12/10/2022  Component Date Value   Rapid Strep A Screen 12/10/2022 Positive (A)    No image results found.   No results found.  DG HIPS BILAT WITH PELVIS 3-4 VIEWS  Result Date: 11/26/2021 CLINICAL DATA:  Bilateral hip pain, left greater than right. EXAM: DG HIP (WITH OR WITHOUT PELVIS) 5V BILAT COMPARISON:  None Available. FINDINGS: Mild degenerative changes are present in the hips bilaterally. Hips are located bilaterally. No acute fractures are present. Osseous changes of the femoral head are more profound on the right. IMPRESSION: 1. Mild degenerative changes in the hips bilaterally. 2. Osseous changes of the femoral head are more profound on the right. Electronically Signed   By: Marin Roberts M.D.   On: 11/26/2021 08:14         Additional Info: This encounter employed real-time, collaborative documentation. The patient actively reviewed and updated their medical record on a shared screen, ensuring transparency and facilitating joint problem-solving for the problem list, overview, and plan. This approach promotes accurate, informed care. The treatment plan was discussed and reviewed in detail, including medication safety, potential side effects, and all patient questions. We confirmed understanding and comfort with the plan. Follow-up instructions were established, including contacting the office for any concerns, returning if symptoms worsen, persist, or new symptoms develop, and precautions for potential emergency department visits.  Medical Decision Making Attestation [phone]  Level of MDM: Low Complexity  1. Number and Complexity of Problems Addressed:    Multiple self-limited or minor problems:    - Upper respiratory  infection with productive cough    - Chronic sinusitis exacerbation    - Stable hypertension monitoring  2. Amount and/or Complexity of Data Reviewed:    Limited:    - Review of patient-provided COVID test result    - Point-of-care strep test ordered and reviewed    - Review of vital signs and exam findings    - Assessment of medication list  3. Risk of Complications and/or Morbidity or Mortality:    Low risk:    - Over-the-counter medication management    - Consideration of prescription medication    - Stable chronic conditions    - No acute severe symptoms  Medical Necessity: Visit and level of service supported by evaluation of multiple minor problems requiring prescription management consideration and monitoring of stable chronic conditions. Care coordination between treating providers maintained.

## 2023-04-23 ENCOUNTER — Other Ambulatory Visit (HOSPITAL_BASED_OUTPATIENT_CLINIC_OR_DEPARTMENT_OTHER): Payer: Self-pay

## 2023-05-10 ENCOUNTER — Other Ambulatory Visit: Payer: Self-pay | Admitting: Family Medicine

## 2023-05-17 ENCOUNTER — Other Ambulatory Visit (HOSPITAL_BASED_OUTPATIENT_CLINIC_OR_DEPARTMENT_OTHER): Payer: Self-pay

## 2023-05-17 MED ORDER — COVID-19 MRNA VAC-TRIS(PFIZER) 30 MCG/0.3ML IM SUSY
0.3000 mL | PREFILLED_SYRINGE | Freq: Once | INTRAMUSCULAR | 0 refills | Status: AC
  Filled 2023-05-17: qty 0.3, 1d supply, fill #0

## 2023-06-02 ENCOUNTER — Other Ambulatory Visit: Payer: Self-pay | Admitting: Family Medicine

## 2023-06-02 ENCOUNTER — Encounter: Payer: Self-pay | Admitting: Family Medicine

## 2023-06-02 ENCOUNTER — Ambulatory Visit (INDEPENDENT_AMBULATORY_CARE_PROVIDER_SITE_OTHER): Payer: Medicare PPO | Admitting: Family Medicine

## 2023-06-02 VITALS — BP 122/70 | HR 78 | Temp 97.8°F | Ht 67.0 in | Wt 227.8 lb

## 2023-06-02 DIAGNOSIS — E785 Hyperlipidemia, unspecified: Secondary | ICD-10-CM

## 2023-06-02 DIAGNOSIS — R739 Hyperglycemia, unspecified: Secondary | ICD-10-CM

## 2023-06-02 DIAGNOSIS — Z0001 Encounter for general adult medical examination with abnormal findings: Secondary | ICD-10-CM | POA: Diagnosis not present

## 2023-06-02 DIAGNOSIS — M5412 Radiculopathy, cervical region: Secondary | ICD-10-CM | POA: Diagnosis not present

## 2023-06-02 DIAGNOSIS — G4733 Obstructive sleep apnea (adult) (pediatric): Secondary | ICD-10-CM

## 2023-06-02 DIAGNOSIS — Z131 Encounter for screening for diabetes mellitus: Secondary | ICD-10-CM

## 2023-06-02 DIAGNOSIS — R350 Frequency of micturition: Secondary | ICD-10-CM | POA: Diagnosis not present

## 2023-06-02 DIAGNOSIS — I7121 Aneurysm of the ascending aorta, without rupture: Secondary | ICD-10-CM

## 2023-06-02 DIAGNOSIS — Z125 Encounter for screening for malignant neoplasm of prostate: Secondary | ICD-10-CM

## 2023-06-02 DIAGNOSIS — E669 Obesity, unspecified: Secondary | ICD-10-CM | POA: Diagnosis not present

## 2023-06-02 DIAGNOSIS — I10 Essential (primary) hypertension: Secondary | ICD-10-CM | POA: Diagnosis not present

## 2023-06-02 LAB — URINALYSIS, ROUTINE W REFLEX MICROSCOPIC
Bilirubin Urine: NEGATIVE
Hgb urine dipstick: NEGATIVE
Ketones, ur: NEGATIVE
Leukocytes,Ua: NEGATIVE
Nitrite: NEGATIVE
RBC / HPF: NONE SEEN (ref 0–?)
Specific Gravity, Urine: 1.015 (ref 1.000–1.030)
Total Protein, Urine: NEGATIVE
Urine Glucose: NEGATIVE
Urobilinogen, UA: 0.2 (ref 0.0–1.0)
WBC, UA: NONE SEEN (ref 0–?)
pH: 6 (ref 5.0–8.0)

## 2023-06-02 LAB — CBC WITH DIFFERENTIAL/PLATELET
Basophils Absolute: 0.1 10*3/uL (ref 0.0–0.1)
Basophils Relative: 0.8 % (ref 0.0–3.0)
Eosinophils Absolute: 0.2 10*3/uL (ref 0.0–0.7)
Eosinophils Relative: 3.1 % (ref 0.0–5.0)
HCT: 42.5 % (ref 39.0–52.0)
Hemoglobin: 14.2 g/dL (ref 13.0–17.0)
Lymphocytes Relative: 18.9 % (ref 12.0–46.0)
Lymphs Abs: 1.3 10*3/uL (ref 0.7–4.0)
MCHC: 33.5 g/dL (ref 30.0–36.0)
MCV: 94.3 fl (ref 78.0–100.0)
Monocytes Absolute: 0.7 10*3/uL (ref 0.1–1.0)
Monocytes Relative: 9.5 % (ref 3.0–12.0)
Neutro Abs: 4.7 10*3/uL (ref 1.4–7.7)
Neutrophils Relative %: 67.7 % (ref 43.0–77.0)
Platelets: 221 10*3/uL (ref 150.0–400.0)
RBC: 4.5 Mil/uL (ref 4.22–5.81)
RDW: 13.7 % (ref 11.5–15.5)
WBC: 7 10*3/uL (ref 4.0–10.5)

## 2023-06-02 LAB — COMPREHENSIVE METABOLIC PANEL WITH GFR
ALT: 31 U/L (ref 0–53)
AST: 22 U/L (ref 0–37)
Albumin: 4.7 g/dL (ref 3.5–5.2)
Alkaline Phosphatase: 48 U/L (ref 39–117)
BUN: 15 mg/dL (ref 6–23)
CO2: 23 meq/L (ref 19–32)
Calcium: 9.2 mg/dL (ref 8.4–10.5)
Chloride: 104 meq/L (ref 96–112)
Creatinine, Ser: 0.79 mg/dL (ref 0.40–1.50)
GFR: 90.72 mL/min (ref >60.00)
Glucose, Bld: 87 mg/dL (ref 70–99)
Potassium: 4.4 meq/L (ref 3.5–5.1)
Sodium: 137 meq/L (ref 135–145)
Total Bilirubin: 1 mg/dL (ref 0.2–1.2)
Total Protein: 6.9 g/dL (ref 6.0–8.3)

## 2023-06-02 LAB — LIPID PANEL
Cholesterol: 138 mg/dL (ref 0–200)
HDL: 54.5 mg/dL (ref >39.00)
LDL Cholesterol: 64 mg/dL (ref 0–99)
NonHDL: 83.94
Total CHOL/HDL Ratio: 3
Triglycerides: 101 mg/dL (ref 0.0–149.0)
VLDL: 20.2 mg/dL (ref 0.0–40.0)

## 2023-06-02 LAB — PSA, MEDICARE: PSA: 7.54 ng/mL — ABNORMAL HIGH (ref 0.10–4.00)

## 2023-06-02 LAB — HEMOGLOBIN A1C: Hgb A1c MFr Bld: 6.1 % (ref 4.6–6.5)

## 2023-06-02 NOTE — Patient Instructions (Addendum)
 Left arm pain appears to be radicular in nature from c8 nerve root- recommended course of prednisone and if not substantially improved within 1-2 weeks to follow up with [facility] for potential imaging and next steps- sooner if worsens  We have placed a referral for you today to sleep medicine- please call their # if you do not hear within a week  [facility]  Dr. Vicie Grain [address], [address] [place], [address] P: [phone]  Goal exercise 150 minutes a week- ok to start with even 10 minutes 3-4 days a week and build up   We have placed a referral for you today to echocardiogram- please call their # if you do not hear within a week - main cardiology # plus please follow up with Dr. Theodis Fiscal  Please stop by lab before you go If you have mychart- we will send your results within 3 business days of us  receiving them.  If you do not have mychart- we will call you about results within 5 business days of us  receiving them.  *please also note that you will see labs on mychart as soon as they post. I will later go in and write notes on them- will say "notes from Dr. Arlene Ben"   Recommended follow up: Return in about 1 year (around 06/01/2024) for physical or sooner if needed.Schedule b4 you leave.

## 2023-06-02 NOTE — Progress Notes (Signed)
 Phone: [phone]   Subjective:  Patient presents today for their annual physical. Chief complaint-noted.   See problem oriented charting- ROS- full  review of systems was completed and negative  Per full ROS sheet completed by patient except for topics noted under acute/chronic concerns  The following were reviewed and entered/updated in epic: Past Medical History:  Diagnosis Date   Allergy    Cataract    removed both eyes   Coronary artery calcification 08/22/2020   Hyperlipidemia    currently under ontrol    Hypertension    Obesity 08/22/2020   Sleep apnea    no cpap    Patient Active Problem List   Diagnosis Date Noted   Former smoker 03/06/2014    Priority: Low   Insomnia 03/06/2014    Priority: Low   Obstructive sleep apnea 05/28/2012    Priority: Low   Seasonal and perennial allergic rhinitis 06/21/2009    Priority: Low   Coronary artery calcification 08/22/2020    Priority: High   Ascending aortic aneurysm (HCC) 08/14/2020    Priority: High   Aortic atherosclerosis (HCC) 08/13/2020    Priority: Medium    Meniere disease, bilateral 07/24/2017    Priority: Medium    Hyperglycemia 02/04/2015    Priority: Medium    Essential hypertension 03/25/2007    Priority: Medium    Hyperlipidemia 07/29/2006    Priority: Medium    Obesity 08/22/2020    Priority: Low   Past Surgical History:  Procedure Laterality Date   APPENDECTOMY     cataract lens implants     bilateral   COLONOSCOPY     x2 last 2010 1 [facility]    DENTAL SURGERY     SINUS IRRIGATION  2003    Family History  Problem Relation Age of Onset   GER disease Mother    Breast cancer Mother    Stroke Mother    Hypertension Father    Heart attack Father        25s, heavy smoker   Hypertension Brother    Diabetes Brother        x2   Hypertension Brother        x2   Heart disease Paternal Grandmother    Nephrolithiasis Daughter    Colon polyps Neg Hx    Colon cancer Neg Hx    Esophageal cancer  Neg Hx    Rectal cancer Neg Hx    Stomach cancer Neg Hx     Medications- reviewed and updated Current Outpatient Medications  Medication Sig Dispense Refill   aspirin EC 81 MG tablet Take 81 mg by mouth daily. Swallow whole.     loratadine  (CLARITIN ) 10 MG tablet Take 1 tablet (10 mg total) by mouth daily. 30 tablet 11   losartan  (COZAAR ) 100 MG tablet TAKE 1 TABLET BY MOUTH DAILY 90 tablet 3   metoprolol  succinate (TOPROL -XL) 25 MG 24 hr tablet TAKE ONE HALF TABLET  (12.5 MG) BY MOUTH DAILY 45 tablet 3   rosuvastatin  (CRESTOR ) 20 MG tablet TAKE ONE TABLET BY MOUTH DAILY 90 tablet 3   Saline (SIMPLY SALINE) 0.9 % AERS Place 2 each into the nose as directed. Use nightly for sinus hygiene long-term.  Can also be used as many times daily as desired to assist with clearing congested sinuses. 127 mL 11   No current facility-administered medications for this visit.    Allergies-reviewed and updated No Known Allergies  Social History   Social History Narrative   Married (  wife patient outside practice) 3 children, 1 step child. 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       Hobbies: formerly played hockey-wants to get back in, golf, plays music   Objective  Objective:  BP 122/70   Pulse 78   Temp 97.8 F (36.6 C)   Ht 5\' 7"  (1.702 m)   Wt 227 lb 12.8 oz (103.3 kg)   SpO2 95%   BMI 35.68 kg/m  Gen: NAD, resting comfortably HEENT: Mucous membranes are moist. Oropharynx normal Neck: no thyromegaly CV: RRR no murmurs rubs or gallops Lungs: CTAB no crackles, wheeze, rhonchi Abdomen: soft/nontender/nondistended/normal bowel sounds. No rebound or guarding.  Ext: no edema Skin: warm, dry Neuro: grossly normal, moves all extremities, PERRLA Declines genitourinary or rectal exam   Assessment and Plan  68 y.o. male presenting for annual physical.  Health Maintenance counseling: 1. Anticipatory guidance: Patient counseled regarding regular dental exams -q3 months, eye  exams -yearly in general,  avoiding smoking and second hand smoker- occasional cigar advised against , limiting alcohol to 2 beverages per day 10 a week or so, no illicit drugs .   2. Risk factor reduction:  Advised patient of need for regular exercise and diet rich and fruits and vegetables to reduce risk of heart attack and stroke.  Exercise- feels could increase exercise.  Diet/weight management-within 1 pounds of last visit- has reduced sugar intake. Trying to do half plate on lunch a few times a week then bring rest home- has at least help with maintenance.  Wt Readings from Last 3 Encounters:  06/02/23 227 lb 12.8 oz (103.3 kg)  12/10/22 228 lb (103.4 kg)  08/20/22 228 lb (103.4 kg)  3. Immunizations/screenings/ancillary studies- up to date- just had COVID a month ago Immunization History  Administered Date(s) Administered   Fluad Quad(high Dose 65+) 10/17/2019, 10/01/2020, 09/23/2021   Influenza Split 11/04/2010, 11/04/2011   Influenza Whole 12/07/2006, 11/05/2008, 10/17/2009   Influenza,inj,Quad PF,6+ Mos 11/30/2012, 09/30/2016, 10/30/2017, 10/08/2018   Influenza-Unspecified 12/16/2013, 10/02/2014, 09/12/2015, 10/30/2017   Moderna Covid-19 Vaccine  Bivalent Booster 55yrs & up 12/16/2020, 05/15/2021   Moderna SARS-COV2 Booster Vaccination 11/19/2019, 05/17/2020, 10/01/2021   Moderna Sars-Covid-2 Vaccination 02/10/2019, 03/11/2019   PNEUMOCOCCAL CONJUGATE-20 11/17/2021   Pfizer(Comirnaty )Fall Seasonal Vaccine 12 years and older 10/22/2022, 05/17/2023   Pneumococcal Conjugate-13 06/04/2020   Td 06/06/2006   Tdap 06/08/2017   Zoster Recombinant(Shingrix ) 08/18/2018, 11/15/2018  4. Prostate cancer screening-  PSA trending up slightly last physical- recheck today. Has noted some increased urination Lab Results  Component Value Date   PSA 2.71 05/09/2021   PSA 2.24 05/01/2020   PSA 2.19 10/18/2019   5. Colon cancer screening - 09/22/18 with 7 year repeat planned.  7. Smoking associated  screening (lung cancer screening, AAA screen 65-75, UA)- former smoker-also still using cigars- recommended cessation but not ready to quit. Will get UA. Under 20 pack years by far.no aneurysm of aorta on 11/14/2021  8. STD screening -only active with wife   Status of chronic or acute concerns   # Left arm pain S: Pain started about a week ago.  Started out in his wrist but seems to have moved up to his shoulder. Numbness down into pinky. Radiates from pinky up the arm into axilla. If coughs aggravates the arm. Sunday before this started was putting up lights around gazebo- a lot of neck tilted upwards.  - Neck pain: none - Hand or arm weakness : mild - no chest  pain or shortness of breath   -has used [facility] in past to evaluation his knee and hip  A/P: Left arm pain appears to be radicular in nature from c8 nerve root- recommended course of prednisone and if not substantially improved within 1-2 weeks to follow up with [facility] for potential imaging and next steps- sooner if worsens -also offered physical therapy but ants to try this first   #Ascending aortic aneurysm-follows with Dr. Theodis Fiscal S: Initially noted on CT cardiac scoring.  Confirmed on CT coronary morphology.  Most recent echocardiogram 05/01/2021 at 43 mm with plan for 1 year follow-up -discussed 2023 avoid quinolones  A/P: aneurysm needs follow up- ordered today and he plans to schedule with Dr. Theodis Fiscal   #hypertension S: compliant with losartan  100 mg daily.  Metoprolol  12.5 mg XR BP Readings from Last 3 Encounters:  06/02/23 122/70  12/10/22 114/82  11/17/21 108/67   A/P: well controlled continue current medications    #Nonobstructive CAD-CT coronary morphology with calcium  score of 20-33 at 97th percentile for age on 09/04/2020-now follows with Dr. Theodis Fiscal #hyperlipidemia #Aortic atherosclerosis S: Medication: Rosuvastatin  20 mg, aspirin 81 mg Lab Results  Component Value Date   CHOL 130  05/09/2021   HDL 53.90 05/09/2021   LDLCALC 63 05/09/2021   TRIG 68.0 05/09/2021   CHOLHDL 2 05/09/2021  A/P: lipids at goal previously- update today Aortic atherosclerosis (presumed stable)- LDL goal ideally <70 - updtae today continue current medications- same goal for coronary artery atherosclerosis   # Hyperglycemia/insulin resistance/prediabetes-family history of diabetes With peak A1c of 6.1 S: Medication: None   Lab Results  Component Value Date   HGBA1C 6.1 11/17/2021   HGBA1C 5.7 05/09/2021   HGBA1C 5.8 10/30/2020  A/P: a1c increased last visit- update today- hoping to reverse trend  #Obstructive sleep apnea-unfortunately unable to tolerate CPAP  -needs daytime nap in afternoon -wants to reevaluate this- will refer to [facility] sleep medicine Dr. Vicie Grain  % Vertigo-BPPV versus Mnire's per Dr. Robley Chow in the spring  -slightly worse lately. This time not improving as moving into the summer. Some ringing in the ears - wants to hold off on ENT follow up for now  Recommended follow up: Return in about 1 year (around 06/01/2024) for physical or sooner if needed.Schedule b4 you leave. Future Appointments  Date Time Provider Department Center  08/23/2023  2:20 PM [facility] ANNUAL WELLNESS VISIT 1 [facility]  06/07/2024  9:00 AM Arlene Ben Saverio Curling, MD [facility]   Lab/Order associations: fasting   ICD-10-CM   1. Preventative health care  Z00.00     2. Screening for diabetes mellitus  Z13.1 Hemoglobin A1c    3. Obesity (BMI 30-39.9)  E66.9 Hemoglobin A1c    4. Screening for prostate cancer  Z12.5 PSA, Medicare    5. Essential hypertension  I10 Comprehensive metabolic panel with GFR    CBC with Differential/Platelet    Lipid panel    6. Hyperlipidemia, unspecified hyperlipidemia type  E78.5 Comprehensive metabolic panel with GFR    CBC with Differential/Platelet    Lipid panel    7. Hyperglycemia  R73.9 Hemoglobin A1c    8. Urinary frequency  R35.0 Urinalysis,  Routine w reflex microscopic    9. Aneurysm of ascending aorta without rupture (HCC)  I71.21 ECHOCARDIOGRAM COMPLETE    10. Obstructive sleep apnea syndrome  G47.33 Ambulatory referral to Sleep Studies     No orders of the defined types were placed in this encounter.  Return precautions advised.  Clarisa Crooked, MD

## 2023-06-03 ENCOUNTER — Telehealth: Payer: Self-pay

## 2023-06-03 ENCOUNTER — Encounter: Payer: Self-pay | Admitting: Family Medicine

## 2023-06-03 ENCOUNTER — Ambulatory Visit: Payer: Self-pay | Admitting: Family Medicine

## 2023-06-03 DIAGNOSIS — R972 Elevated prostate specific antigen [PSA]: Secondary | ICD-10-CM

## 2023-06-03 MED ORDER — PREDNISONE 20 MG PO TABS: ORAL_TABLET | ORAL | 0 refills | Status: DC

## 2023-06-03 NOTE — Addendum Note (Signed)
 Addended by: Almira Jaeger on: 06/03/2023 05:16 PM   Modules accepted: Orders

## 2023-06-03 NOTE — Telephone Encounter (Signed)
 See below. Its in your note but dosing info not provided.  Copied from CRM [id number]. Topic: Clinical - Medication Question >> Jun 03, 2023  9:18 AM [place] E wrote: Reason for CRM: Patient had an appointment yesterday with PCP and stated how he was supposed to get a prescription sent over for prednisone, relayed to patient that this may take up to 3 days. Agent could not locate this medication on med list or within that office visit note. Callback number for patient is [phone].

## 2023-06-15 ENCOUNTER — Encounter: Payer: Self-pay | Admitting: Family Medicine

## 2023-06-22 DIAGNOSIS — M542 Cervicalgia: Secondary | ICD-10-CM | POA: Diagnosis not present

## 2023-06-22 DIAGNOSIS — M79602 Pain in left arm: Secondary | ICD-10-CM | POA: Diagnosis not present

## 2023-06-22 DIAGNOSIS — M5030 Other cervical disc degeneration, unspecified cervical region: Secondary | ICD-10-CM | POA: Diagnosis not present

## 2023-07-14 DIAGNOSIS — M542 Cervicalgia: Secondary | ICD-10-CM | POA: Diagnosis not present

## 2023-07-15 ENCOUNTER — Ambulatory Visit (HOSPITAL_COMMUNITY)
Admission: RE | Admit: 2023-07-15 | Discharge: 2023-07-15 | Disposition: A | Discharge status: Home / Self Care | Source: Ambulatory Visit | Attending: Family Medicine | Admitting: Family Medicine

## 2023-07-15 DIAGNOSIS — I7121 Aneurysm of the ascending aorta, without rupture: Secondary | ICD-10-CM | POA: Insufficient documentation

## 2023-07-15 LAB — ECHOCARDIOGRAM COMPLETE
Area-P 1/2: 3.21 cm2
S' Lateral: 2.8 cm

## 2023-07-19 ENCOUNTER — Ambulatory Visit: Admitting: Family Medicine

## 2023-07-19 ENCOUNTER — Encounter: Payer: Self-pay | Admitting: Family Medicine

## 2023-07-19 ENCOUNTER — Ambulatory Visit: Payer: Self-pay

## 2023-07-19 VITALS — BP 132/70 | HR 69 | Temp 97.6°F | Ht 67.0 in | Wt 230.0 lb

## 2023-07-19 DIAGNOSIS — I10 Essential (primary) hypertension: Secondary | ICD-10-CM

## 2023-07-19 DIAGNOSIS — J329 Chronic sinusitis, unspecified: Secondary | ICD-10-CM

## 2023-07-19 DIAGNOSIS — B9689 Other specified bacterial agents as the cause of diseases classified elsewhere: Secondary | ICD-10-CM

## 2023-07-19 MED ORDER — AMOXICILLIN-POT CLAVULANATE 875-125 MG PO TABS: 1.0000 | ORAL_TABLET | Freq: Two times a day (BID) | ORAL | 0 refills | Status: AC

## 2023-07-19 NOTE — Patient Instructions (Addendum)
 Sinus infection/Sinusitis Bacterial based on: Symptoms >10 days, double sickening  Treatment: -symptomatic care with Plain mucinex or mucinex- DM (if you want to have something to help with cough as well), Sinus rinses like a Neti Pot or Neilmed sinus rinse (make sure to follow instructions for water preparation, Tylenol 650 mg every 6 hours to help with sinus pressure -Antibiotic indicated: yes will use augmentin   Finally, we reviewed reasons to return to care including if symptoms worsen or persist  (despite above treatments) or new concerns arise (particularly fever or shortness of breath)

## 2023-07-19 NOTE — Progress Notes (Signed)
 Phone [phone] In person visit   Subjective:   Scott Neal is a 68 y.o. year old very pleasant male patient who presents for/with See problem oriented charting Chief Complaint  Patient presents with   sinus congestion    Pt c/o sinus congestion and cough x 10 days, has gotten better but got worse this past Thursday c/o yellow mucous today.   Hemorrhoids    Past Medical History-  Patient Active Problem List   Diagnosis Date Noted   Former smoker 03/06/2014    Priority: Low   Insomnia 03/06/2014    Priority: Low   Obstructive sleep apnea 05/28/2012    Priority: Low   Seasonal and perennial allergic rhinitis 06/21/2009    Priority: Low   Coronary artery calcification 08/22/2020    Priority: High   Ascending aortic aneurysm (HCC) 08/14/2020    Priority: High   Aortic atherosclerosis (HCC) 08/13/2020    Priority: Medium    Meniere disease, bilateral 07/24/2017    Priority: Medium    Hyperglycemia 02/04/2015    Priority: Medium    Essential hypertension 03/25/2007    Priority: Medium    Hyperlipidemia 07/29/2006    Priority: Medium    Obesity 08/22/2020    Priority: Low    Medications- reviewed and updated Current Outpatient Medications  Medication Sig Dispense Refill   amoxicillin -clavulanate (AUGMENTIN ) 875-125 MG tablet Take 1 tablet by mouth 2 (two) times daily for 7 days. 14 tablet 0   aspirin EC 81 MG tablet Take 81 mg by mouth daily. Swallow whole.     loratadine  (CLARITIN ) 10 MG tablet Take 1 tablet (10 mg total) by mouth daily. 30 tablet 11   losartan  (COZAAR ) 100 MG tablet TAKE 1 TABLET BY MOUTH DAILY 90 tablet 3   metoprolol  succinate (TOPROL -XL) 25 MG 24 hr tablet TAKE ONE HALF TABLET  (12.5 MG) BY MOUTH DAILY 45 tablet 3   rosuvastatin  (CRESTOR ) 20 MG tablet TAKE 1 TABLET BY MOUTH DAILY 90 tablet 3   Saline (SIMPLY SALINE) 0.9 % AERS Place 2 each into the nose as directed. Use nightly for sinus hygiene long-term.  Can also be used as many times daily  as desired to assist with clearing congested sinuses. 127 mL 11   predniSONE  (DELTASONE ) 20 MG tablet Take 1 tablet by mouth daily for 5 days, then 1/2 tablet daily for 2 days 6 tablet 0   No current facility-administered medications for this visit.     Objective:  BP 132/70   Pulse 69   Temp 97.6 F (36.4 C)   Ht 5' 7 (1.702 m)   Wt 230 lb (104.3 kg)   SpO2 96%   BMI 36.02 kg/m  Gen: NAD, resting comfortably  HEENT: Turbinates erythematous with yellow drainage, TM normal, pharynx mildly erythematous with no tonsilar exudate or edema, maxillary and frontal sinus tenderness bilaterally  CV: RRR no murmurs rubs or gallops Lungs: CTAB no crackles, wheeze, rhonchi  Ext: trace edema Skin: warm, dry      Assessment and Plan    #working with [facility]- has had MRI and pending the read/visit to discuss results- ongoing issues. We did prednisone  and   # Sinus pressure/congestion S: Patient complains of sinus congestion and cough for the last 10 days. Some sore throat but improving. Some subjective fevers but no measured fevers.  Seem to be getting better but worsened this past Thursday.  Has noted yellow mucus including today. Some ear pressure. Traveling to [place] soon. Has  not tried anything yet other than ibuprofen for pressure -wife had similar and tested negative for COVID and strep- she is now on antibiotic(s) and doing much better.  A/P: Sinus infection/Sinusitis Bacterial based on: Symptoms >10 days, double sickening  Treatment: -symptomatic care with Plain mucinex or mucinex- DM (if you want to have something to help with cough as well), Sinus rinses like a Neti Pot or Neilmed sinus rinse (make sure to follow instructions for water preparation, Tylenol 650 mg every 6 hours to help with sinus pressure -Antibiotic indicated: yes will use augmentin   Finally, we reviewed reasons to return to care including if symptoms worsen or persist  (despite above treatments) or  new concerns arise (particularly fever or shortness of breath)  # Hemorrhoids S:hemorrhoid about the side of his pinky that got bigger on prednisone . Witch hazel- tucks. Able to have good bowel movement without straining. Not bleeding. Colonoscopy 2020 and repeat will be 2027.   A/P: reports hemorrhoid- if worsens could consider surgical consult- opts out of rnow  #August visit with urology to discuss PSA  Lab Results  Component Value Date   PSA 7.54 (H) 06/02/2023   PSA 2.71 05/09/2021   PSA 2.24 05/01/2020   Recommended follow up: Return for as needed for new, worsening, persistent symptoms. Future Appointments  Date Time Provider Department Center  08/23/2023  2:20 PM [facility] ANNUAL WELLNESS VISIT 1 [facility]  06/07/2024  9:00 AM Katrinka Garnette KIDD, MD [facility]    Lab/Order associations:   ICD-10-CM   1. Bacterial sinusitis  J32.9    B96.89     2. Essential hypertension  I10       Meds ordered this encounter  Medications   amoxicillin -clavulanate (AUGMENTIN ) 875-125 MG tablet    Sig: Take 1 tablet by mouth 2 (two) times daily for 7 days.    Dispense:  14 tablet    Refill:  0    Return precautions advised.  Garnette Katrinka, MD

## 2023-07-19 NOTE — Telephone Encounter (Addendum)
 FYI Only or Action Required?: FYI only for provider.  Patient was last seen in primary care on 06/02/2023 by Katrinka Garnette KIDD, MD.  Called Nurse Triage reporting Cough, Sore Throat, Nasal Congestion, and Hemorrhoids.  Symptoms began a week ago.  Interventions attempted: Nothing.  Symptoms are: stable.  Triage Disposition: See PCP When Office is Open (Within 3 Days)  Patient/caregiver understands and will follow disposition?: Yes                             Copied from CRM [id number]. Topic: Clinical - Red Word Triage >> Jul 19, 2023  9:23 AM Willma SAUNDERS wrote: Red Word that prompted transfer to Nurse Triage: Patient thinks he has a sinus infection. Has a productive cough with yellow mucous. Symptoms have been ongoing for about 2 weeks. Also has a Hemorid he would like to discuss, states its a bubble the size of half a grape. Reason for Disposition  [1] Sinus congestion (pressure, fullness) AND [2] present > 10 days  Answer Assessment - Initial Assessment Questions 1. LOCATION: Where does it hurt?      Denies pain, just pressure 2. ONSET: When did the sinus pain start?  (e.g., hours, days)      10 days 5. NASAL CONGESTION: Is the nose blocked? If Yes, ask: Can you open it or must you breathe through your mouth?     States he is able to breathe through his mouth when needed  6. NASAL DISCHARGE: Do you have discharge from your nose? If so ask, What color?     Yellowish 7. FEVER: Do you have a fever? If Yes, ask: What is it, how was it measured, and when did it start?      Denies  8. OTHER SYMPTOMS: Do you have any other symptoms? (e.g., sore throat, cough, earache, difficulty breathing)     Productive cough and sore throat, denies headache    Patient also wants to discuss hemorrhoid with PCP. Denies bleeding and itching.  Protocols used: Sinus Pain or Congestion-A-AH

## 2023-07-29 DIAGNOSIS — L821 Other seborrheic keratosis: Secondary | ICD-10-CM | POA: Diagnosis not present

## 2023-07-29 DIAGNOSIS — D229 Melanocytic nevi, unspecified: Secondary | ICD-10-CM | POA: Diagnosis not present

## 2023-07-29 DIAGNOSIS — L820 Inflamed seborrheic keratosis: Secondary | ICD-10-CM | POA: Diagnosis not present

## 2023-07-29 DIAGNOSIS — L578 Other skin changes due to chronic exposure to nonionizing radiation: Secondary | ICD-10-CM | POA: Diagnosis not present

## 2023-07-29 DIAGNOSIS — D1801 Hemangioma of skin and subcutaneous tissue: Secondary | ICD-10-CM | POA: Diagnosis not present

## 2023-07-29 DIAGNOSIS — L814 Other melanin hyperpigmentation: Secondary | ICD-10-CM | POA: Diagnosis not present

## 2023-07-29 DIAGNOSIS — L570 Actinic keratosis: Secondary | ICD-10-CM | POA: Diagnosis not present

## 2023-08-10 DIAGNOSIS — I10 Essential (primary) hypertension: Secondary | ICD-10-CM | POA: Diagnosis not present

## 2023-08-10 DIAGNOSIS — G4733 Obstructive sleep apnea (adult) (pediatric): Secondary | ICD-10-CM | POA: Diagnosis not present

## 2023-08-16 ENCOUNTER — Other Ambulatory Visit: Payer: Self-pay | Admitting: Urology

## 2023-08-16 DIAGNOSIS — N401 Enlarged prostate with lower urinary tract symptoms: Secondary | ICD-10-CM | POA: Diagnosis not present

## 2023-08-16 DIAGNOSIS — R350 Frequency of micturition: Secondary | ICD-10-CM | POA: Diagnosis not present

## 2023-08-16 DIAGNOSIS — R972 Elevated prostate specific antigen [PSA]: Secondary | ICD-10-CM | POA: Diagnosis not present

## 2023-08-18 ENCOUNTER — Encounter: Payer: Self-pay | Admitting: Urology

## 2023-08-23 ENCOUNTER — Ambulatory Visit (INDEPENDENT_AMBULATORY_CARE_PROVIDER_SITE_OTHER): Payer: Medicare PPO

## 2023-08-23 VITALS — Ht 67.0 in | Wt 230.0 lb

## 2023-08-23 DIAGNOSIS — Z0000 Encounter for general adult medical examination without abnormal findings: Secondary | ICD-10-CM

## 2023-08-23 DIAGNOSIS — R972 Elevated prostate specific antigen [PSA]: Secondary | ICD-10-CM | POA: Diagnosis not present

## 2023-08-23 NOTE — Progress Notes (Signed)
 Subjective:   Scott Neal is a 68 y.o. who presents for a Medicare Wellness preventive visit.  As a reminder, Annual Wellness Visits don't include a physical exam, and some assessments may be limited, especially if this visit is performed virtually. We may recommend an in-person follow-up visit with your provider if needed.  Visit Complete: Virtual I connected with  Scott Neal on 08/23/23 by a audio enabled telemedicine application and verified that I am speaking with the correct person using two identifiers.  Patient Location: Home  Provider Location: Office/Clinic  I discussed the limitations of evaluation and management by telemedicine. The patient expressed understanding and agreed to proceed.  Vital Signs: Because this visit was a virtual/telehealth visit, some criteria may be missing or patient reported. Any vitals not documented were not able to be obtained and vitals that have been documented are patient reported.  VideoDeclined- This patient declined Librarian, academic. Therefore the visit was completed with audio only.  Persons Participating in Visit: Patient.  AWV Questionnaire: No: Patient Medicare AWV questionnaire was not completed prior to this visit.  Cardiac Risk Factors include: advanced age (>7men, >43 women);dyslipidemia;hypertension;male gender;obesity (BMI >30kg/m2);smoking/ tobacco exposure     Objective:    Today's Vitals   08/23/23 1421  Weight: 230 lb (104.3 kg)  Height: 5' 7 (1.702 m)   Body mass index is 36.02 kg/m.     08/23/2023    2:24 PM 08/20/2022    3:19 PM 07/24/2021   10:21 AM 06/03/2021    9:30 AM 05/28/2015    1:51 PM  Advanced Directives  Does Patient Have a Medical Advance Directive? Yes Yes Yes No Yes   Type of Estate agent of [place];Living will Healthcare Power of [place];Living will Healthcare Power of Attorney  Living will   Copy of Healthcare Power of Attorney in Chart?  No - copy requested No - copy requested No - copy requested    Would patient like information on creating a medical advance directive?    No - Patient declined      Data saved with a previous flowsheet row definition    Current Medications (verified) Outpatient Encounter Medications as of 08/23/2023  Medication Sig   aspirin EC 81 MG tablet Take 81 mg by mouth daily. Swallow whole.   losartan  (COZAAR ) 100 MG tablet TAKE 1 TABLET BY MOUTH DAILY   metoprolol  succinate (TOPROL -XL) 25 MG 24 hr tablet TAKE ONE HALF TABLET  (12.5 MG) BY MOUTH DAILY   rosuvastatin  (CRESTOR ) 20 MG tablet TAKE 1 TABLET BY MOUTH DAILY   loratadine  (CLARITIN ) 10 MG tablet Take 1 tablet (10 mg total) by mouth daily. (Patient not taking: Reported on 08/23/2023)   Saline (SIMPLY SALINE) 0.9 % AERS Place 2 each into the nose as directed. Use nightly for sinus hygiene long-term.  Can also be used as many times daily as desired to assist with clearing congested sinuses. (Patient not taking: Reported on 08/23/2023)   No facility-administered encounter medications on file as of 08/23/2023.    Allergies (verified) Patient has no known allergies.   History: Past Medical History:  Diagnosis Date   Allergy    Cataract    removed both eyes   Coronary artery calcification 08/22/2020   Hyperlipidemia    currently under ontrol    Hypertension    Obesity 08/22/2020   Sleep apnea    no cpap    Past Surgical History:  Procedure Laterality Date   APPENDECTOMY  cataract lens implants     bilateral   COLONOSCOPY     x2 last 2010 1 HPP    DENTAL SURGERY     SINUS IRRIGATION  2003   Family History  Problem Relation Age of Onset   GER disease Mother    Breast cancer Mother    Stroke Mother    Hypertension Father    Heart attack Father        34s, heavy smoker   Hypertension Brother    Diabetes Brother        x2   Hypertension Brother        x2   Heart disease Paternal Grandmother    Nephrolithiasis Daughter     Colon polyps Neg Hx    Colon cancer Neg Hx    Esophageal cancer Neg Hx    Rectal cancer Neg Hx    Stomach cancer Neg Hx    Social History   Socioeconomic History   Marital status: Married    Spouse name: Not on file   Number of children: Not on file   Years of education: Not on file   Highest education level: Not on file  Occupational History   Not on file  Tobacco Use   Smoking status: Some Days    Current packs/day: 0.00    Average packs/day: 0.8 packs/day for 4.0 years (3.0 ttl pk-yrs)    Types: Cigarettes, Cigars    Start date: 03/11/1993    Last attempt to quit: 03/11/1997    Years since quitting: 26.4   Smokeless tobacco: Never  Vaping Use   Vaping status: Never Used  Substance and Sexual Activity   Alcohol use: Yes    Alcohol/week: 10.0 standard drinks of alcohol    Types: 10 Standard drinks or equivalent per week   Drug use: No   Sexual activity: Yes  Other Topics Concern   Not on file  Social History Narrative   Married (wife patient outside practice) 3 children, 1 step child. 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       Hobbies: formerly played hockey-wants to get back in, golf, plays music   Social Drivers of Health   Financial Resource Strain: Low Risk  (08/23/2023)   Overall Financial Resource Strain (CARDIA)    Difficulty of Paying Living Expenses: Not hard at all  Food Insecurity: No Food Insecurity (08/23/2023)   Hunger Vital Sign    Worried About Running Out of Food in the Last Year: Never true    Ran Out of Food in the Last Year: Never true  Transportation Needs: No Transportation Needs (08/23/2023)   PRAPARE - Administrator, Civil Service (Medical): No    Lack of Transportation (Non-Medical): No  Physical Activity: Insufficiently Active (08/23/2023)   Exercise Vital Sign    Days of Exercise per Week: 1 day    Minutes of Exercise per Session: 60 min  Stress: No Stress Concern Present (08/23/2023)   [employer] of  Occupational Health - Occupational Stress Questionnaire    Feeling of Stress: Only a little  Social Connections: Socially Integrated (08/23/2023)   Social Connection and Isolation Panel    Frequency of Communication with Friends and Family: More than three times a week    Frequency of Social Gatherings with Friends and Family: More than three times a week    Attends Religious Services: More than 4 times per year    Active Member of [employer]  or Organizations: Yes    Attends Banker Meetings: 1 to 4 times per year    Marital Status: Married    Tobacco Counseling Ready to quit: Not Answered Counseling given: Not Answered    Clinical Intake:  Pre-visit preparation completed: Yes  Pain : No/denies pain     BMI - recorded: 36.02 Nutritional Status: BMI > 30  Obese Nutritional Risks: None Diabetes: No  Lab Results  Component Value Date   HGBA1C 6.1 06/02/2023   HGBA1C 6.1 11/17/2021   HGBA1C 5.7 05/09/2021     How often do you need to have someone help you when you read instructions, pamphlets, or other written materials from your doctor or pharmacy?: 1 - Never  Interpreter Needed?: No  Information entered by :: Ellouise Haws, LPN   Activities of Daily Living     08/23/2023    2:22 PM  In your present state of health, do you have any difficulty performing the following activities:  Hearing? 0  Vision? 0  Difficulty concentrating or making decisions? 0  Walking or climbing stairs? 0  Dressing or bathing? 0  Doing errands, shopping? 0  Preparing Food and eating ? N  Using the Toilet? N  In the past six months, have you accidently leaked urine? N  Do you have problems with loss of bowel control? N  Managing your Medications? N  Managing your Finances? N  Housekeeping or managing your Housekeeping? N    Patient Care Team: Katrinka Garnette KIDD, MD as PCP - General (Family Medicine) Livingston Rigg, MD as Consulting Physician (Dermatology)  I have updated  your Care Teams any recent Medical Services you may have received from other providers in the past year.     Assessment:   This is a routine wellness examination for Scott Neal.  Hearing/Vision screen Hearing Screening - Comments:: Pt denies any hearing issues  Vision Screening - Comments:: Wears rx glasses - up to date with routine eye exams with [facility] ophthalmology    Goals Addressed             This Visit's Progress    Patient Stated       Lose weight        Depression Screen     08/23/2023    2:25 PM 08/20/2022    3:19 PM 11/17/2021    3:11 PM 07/24/2021   10:20 AM 10/30/2020    8:51 AM 05/09/2020   11:13 AM 10/18/2019   11:48 AM  PHQ 2/9 Scores  PHQ - 2 Score 0 0 0 0 0 0 0  PHQ- 9 Score      0     Fall Risk     08/23/2023    2:27 PM 08/20/2022    3:22 PM 11/17/2021    3:10 PM 07/24/2021   10:22 AM 10/30/2020    8:51 AM  Fall Risk   Falls in the past year? 0 0 1 1 0  Number falls in past yr: 0 0 0 1 0  Injury with Fall? 0 0 1 1 0  Comment    snorkeling   Risk for fall due to : No Fall Risks Impaired vision  Impaired vision   Follow up Falls prevention discussed Falls prevention discussed  Falls prevention discussed  Falls evaluation completed      Data saved with a previous flowsheet row definition    MEDICARE RISK AT HOME:  Medicare Risk at Home Any stairs in or around the home?: Yes  If so, are there any without handrails?: No Home free of loose throw rugs in walkways, pet beds, electrical cords, etc?: Yes Adequate lighting in your home to reduce risk of falls?: Yes Life alert?: No Use of a cane, walker or w/c?: No Grab bars in the bathroom?: Yes Shower chair or bench in shower?: No Elevated toilet seat or a handicapped toilet?: No  TIMED UP AND GO:  Was the test performed?  No  Cognitive Function: 6CIT completed        08/23/2023    2:27 PM 08/20/2022    3:23 PM  6CIT Screen  What Year? 0 points 0 points  What month? 0 points 0 points  What  time? 0 points 0 points  Count back from 20 0 points 0 points  Months in reverse 0 points 0 points  Repeat phrase 0 points 0 points  Total Score 0 points 0 points    Immunizations Immunization History  Administered Date(s) Administered   Fluad Quad(high Dose 65+) 10/17/2019, 10/01/2020, 09/23/2021   Influenza Split 11/04/2010, 11/04/2011   Influenza Whole 12/07/2006, 11/05/2008, 10/17/2009   Influenza,inj,Quad PF,6+ Mos 11/30/2012, 09/30/2016, 10/30/2017, 10/08/2018   Influenza-Unspecified 12/16/2013, 10/02/2014, 09/12/2015, 10/30/2017   Moderna Covid-19 Vaccine  Bivalent Booster 52yrs & up 12/16/2020, 05/15/2021   Moderna SARS-COV2 Booster Vaccination 11/19/2019, 05/17/2020, 10/01/2021   Moderna Sars-Covid-2 Vaccination 02/10/2019, 03/11/2019   PNEUMOCOCCAL CONJUGATE-20 11/17/2021   Pfizer(Comirnaty )Fall Seasonal Vaccine 12 years and older 10/22/2022, 05/17/2023   Pneumococcal Conjugate-13 06/04/2020   Td 06/06/2006   Tdap 06/08/2017   Zoster Recombinant(Shingrix ) 08/18/2018, 11/15/2018    Screening Tests Health Maintenance  Topic Date Due   COVID-19 Vaccine (7 - 2024-25 season) 07/12/2023   INFLUENZA VACCINE  08/06/2023   Medicare Annual Wellness (AWV)  08/22/2024   Colonoscopy  09/21/2025   DTaP/Tdap/Td (3 - Td or Tdap) 06/09/2027   Pneumococcal Vaccine: 50+ Years  Completed   Hepatitis C Screening  Completed   Zoster Vaccines- Shingrix   Completed   HPV VACCINES  Aged Out   Meningococcal B Vaccine  Aged Out   Pneumococcal Vaccine  Discontinued    Health Maintenance  Health Maintenance Due  Topic Date Due   COVID-19 Vaccine (7 - 2024-25 season) 07/12/2023   INFLUENZA VACCINE  08/06/2023   Health Maintenance Items Addressed: See Nurse Notes at the end of this note  Additional Screening:  Vision Screening: Recommended annual ophthalmology exams for early detection of glaucoma and other disorders of the eye. Would you like a referral to an eye doctor? No     Dental Screening: Recommended annual dental exams for proper oral hygiene  Community Resource Referral / Chronic Care Management: CRR required this visit?  No   CCM required this visit?  No   Plan:    I have personally reviewed and noted the following in the patient's chart:   Medical and social history Use of alcohol, tobacco or illicit drugs  Current medications and supplements including opioid prescriptions. Patient is not currently taking opioid prescriptions. Functional ability and status Nutritional status Physical activity Advanced directives List of other physicians Hospitalizations, surgeries, and ER visits in previous 12 months Vitals Screenings to include cognitive, depression, and falls Referrals and appointments  In addition, I have reviewed and discussed with patient certain preventive protocols, quality metrics, and best practice recommendations. A written personalized care plan for preventive services as well as general preventive health recommendations were provided to patient.   Ellouise VEAR Haws, LPN   1/81/7974   After Visit  Summary: (MyChart) Due to this being a telephonic visit, the after visit summary with patients personalized plan was offered to patient via MyChart   Notes: Nothing significant to report at this time.

## 2023-08-23 NOTE — Patient Instructions (Signed)
 Mr. Volkert , Thank you for taking time out of your busy schedule to complete your Annual Wellness Visit with me. I enjoyed our conversation and look forward to speaking with you again next year. I, as well as your care team,  appreciate your ongoing commitment to your health goals. Please review the following plan we discussed and let me know if I can assist you in the future. Your Game plan/ To Do List    Referrals: If you haven't heard from the office you've been referred to, please reach out to them at the phone provided.   Follow up Visits: We will see or speak with you next year for your Next Medicare AWV with our clinical staff Have you seen your provider in the last 6 months (3 months if uncontrolled diabetes)? Yes  Clinician Recommendations:  Aim for 30 minutes of exercise or brisk walking, 6-8 glasses of water, and 5 servings of fruits and vegetables each day.       This is a list of the screenings recommended for you:  Health Maintenance  Topic Date Due   COVID-19 Vaccine (7 - 2024-25 season) 07/12/2023   Medicare Annual Wellness Visit  08/20/2023   Flu Shot  08/06/2023   Colon Cancer Screening  09/21/2025   DTaP/Tdap/Td vaccine (3 - Td or Tdap) 06/09/2027   Pneumococcal Vaccine for age over 4  Completed   Hepatitis C Screening  Completed   Zoster (Shingles) Vaccine  Completed   HPV Vaccine  Aged Out   Meningitis B Vaccine  Aged Out   Pneumococcal Vaccine  Discontinued    Advanced directives: (Copy Requested) Please bring a copy of your health care power of attorney and living will to the office to be added to your chart at your convenience. You can mail to [facility] [address] [place], [place] [address] or email to [email or web address] Advance Care Planning is important because it:  [x]  Makes sure you receive the medical care that is consistent with your values, goals, and preferences  [x]  It provides guidance to your family and loved ones  and reduces their decisional burden about whether or not they are making the right decisions based on your wishes.  Follow the link provided in your after visit summary or read over the paperwork we have mailed to you to help you started getting your Advance Directives in place. If you need assistance in completing these, please reach out to us  so that we can help you!  See attachments for Preventive Care and Fall Prevention Tips.

## 2023-08-25 DIAGNOSIS — M5412 Radiculopathy, cervical region: Secondary | ICD-10-CM | POA: Diagnosis not present

## 2023-09-02 ENCOUNTER — Inpatient Hospital Stay
Admission: RE | Admit: 2023-09-02 | Discharge: 2023-09-02 | Disposition: A | Discharge status: Home / Self Care | Source: Ambulatory Visit | Attending: Urology | Admitting: Urology

## 2023-09-02 DIAGNOSIS — R972 Elevated prostate specific antigen [PSA]: Secondary | ICD-10-CM | POA: Diagnosis not present

## 2023-09-02 MED ORDER — GADOPICLENOL 0.5 MMOL/ML IV SOLN
10.0000 mL | Freq: Once | INTRAVENOUS | Status: AC | PRN
  Administered 2023-09-02: 10 mL via INTRAVENOUS

## 2023-09-15 DIAGNOSIS — G4733 Obstructive sleep apnea (adult) (pediatric): Secondary | ICD-10-CM | POA: Diagnosis not present

## 2023-09-17 DIAGNOSIS — H43813 Vitreous degeneration, bilateral: Secondary | ICD-10-CM | POA: Diagnosis not present

## 2023-09-17 DIAGNOSIS — H5202 Hypermetropia, left eye: Secondary | ICD-10-CM | POA: Diagnosis not present

## 2023-09-21 IMAGING — DX DG RIBS W/ CHEST 3+V*R*
5 series · 5 of 5 positions shown · non-contrast
Comparison: None Available.

CLINICAL DATA: Fall, right rib pain

EXAM:
RIGHT RIBS AND CHEST - 3+ VIEW

[chest pa]
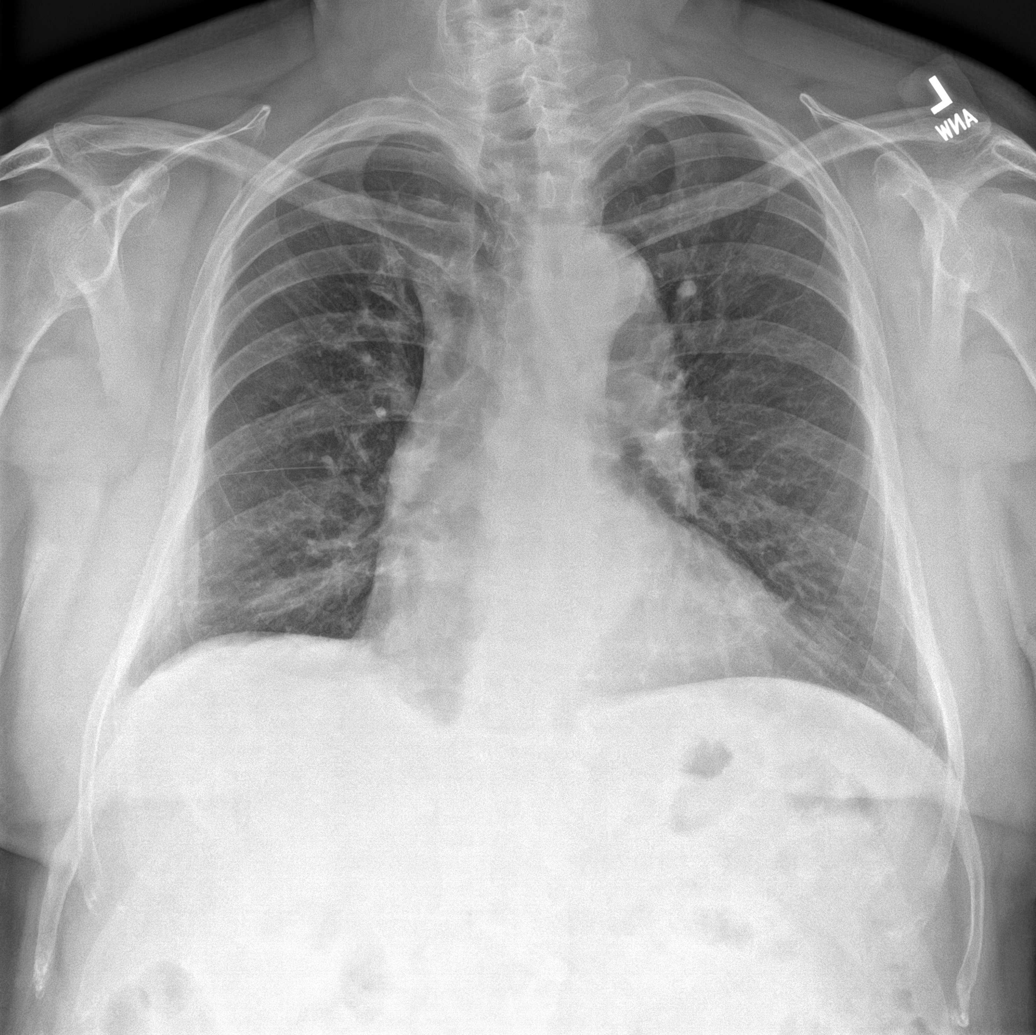

[rib pa (1 of 2)]
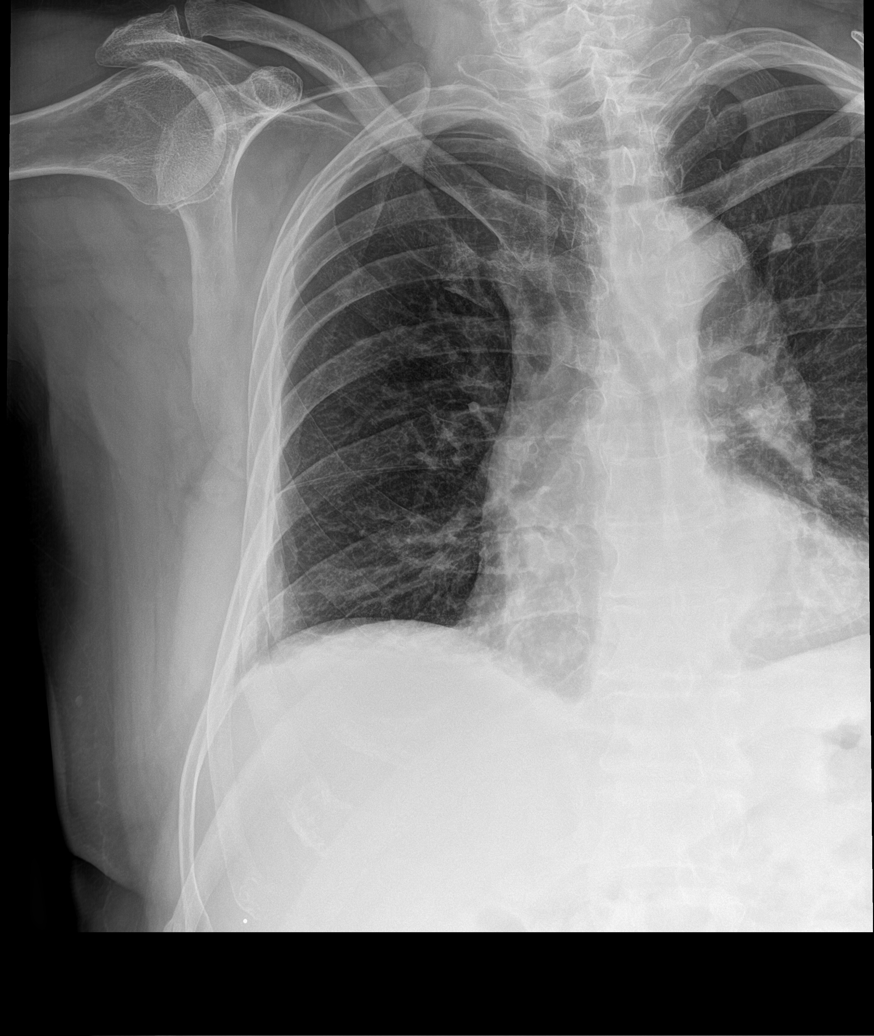

[rib obl (1 of 2)]
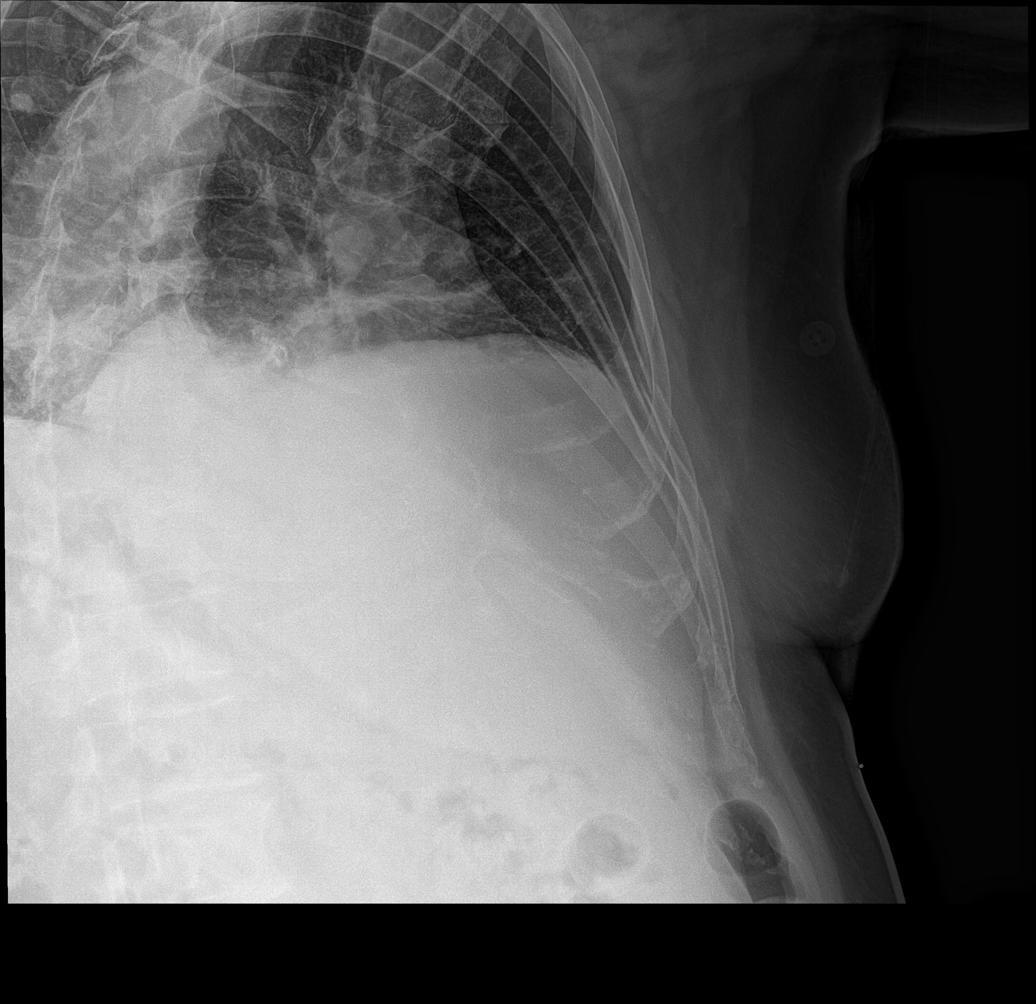

[rib pa (2 of 2)]
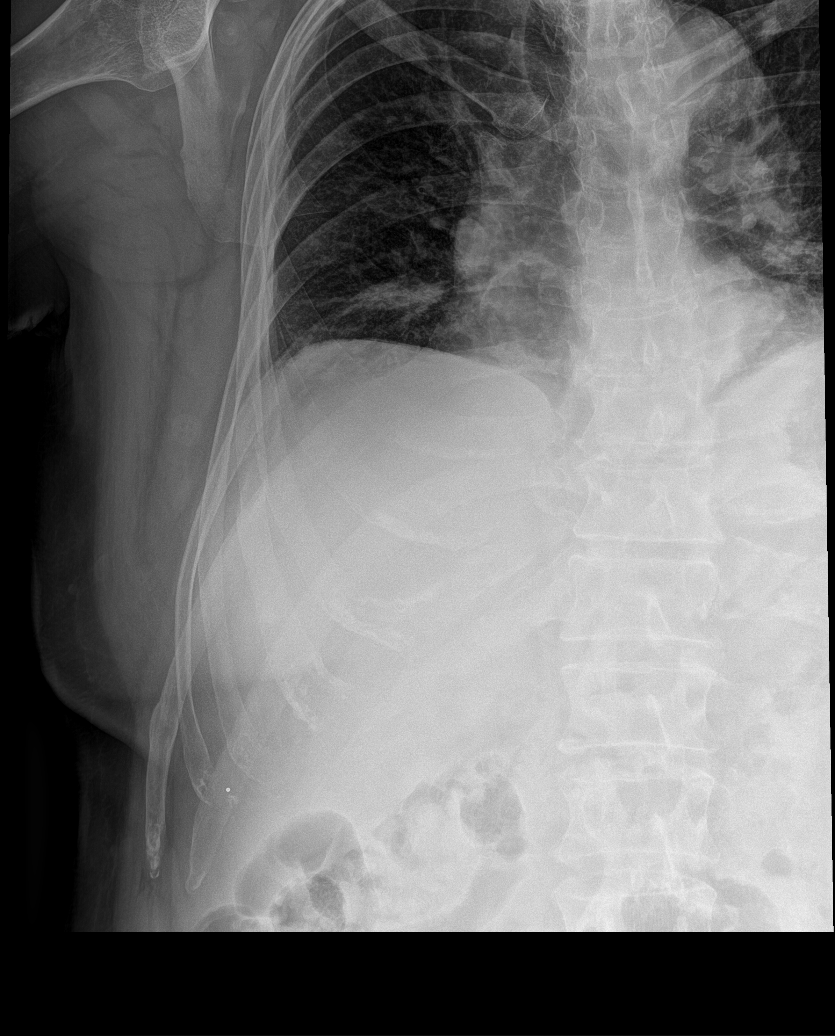

[rib obl (2 of 2)]
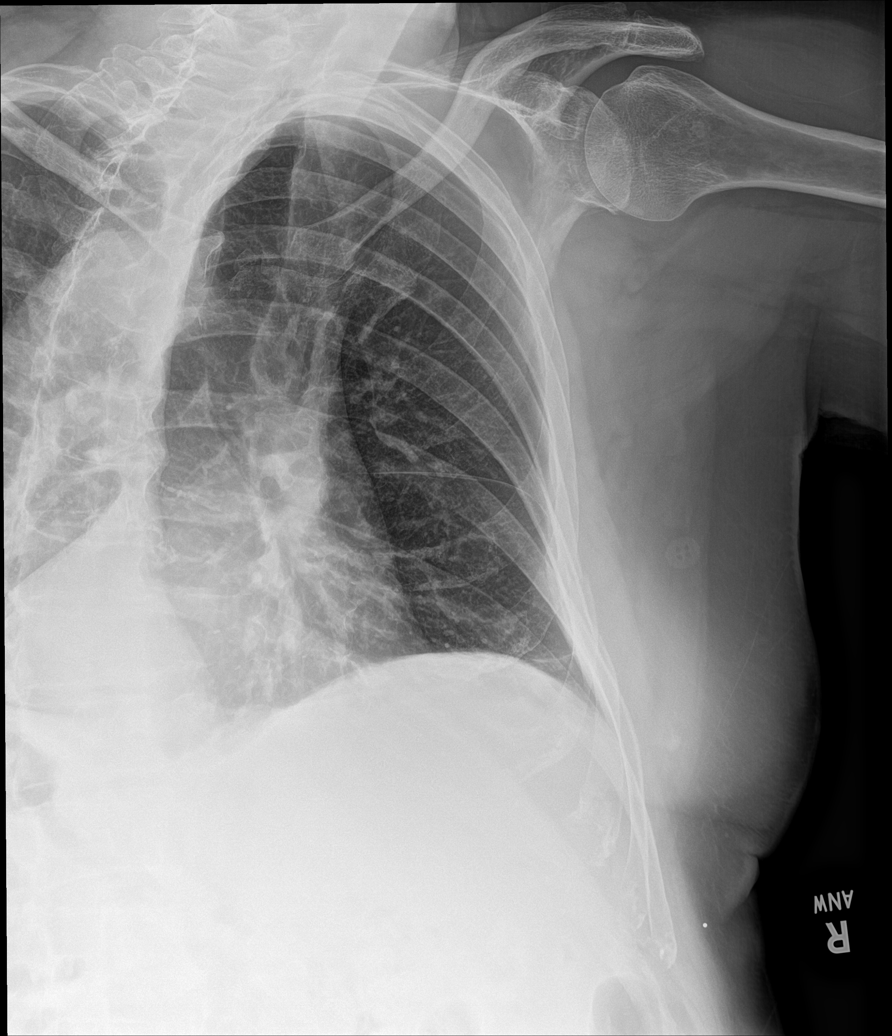

[5 of 5 positions shown; findings below may reference images not displayed]

FINDINGS: Cardiac and mediastinal contours normal. Pulmonary vascularity
normal. Calcified granuloma left upper lobe. Lungs clear without
infiltrate or effusion. No pneumothorax

Fracture right sixth rib laterally with mild displacement. No other
acute fracture identified. Possible additional chronic right rib
fractures.
IMPRESSION: Mildly displaced fracture right sixth rib

No acute cardiopulmonary abnormality.

## 2023-10-05 ENCOUNTER — Telehealth: Payer: Self-pay | Admitting: Family Medicine

## 2023-10-05 NOTE — Telephone Encounter (Signed)
 Please see message and advise.    Copied from CRM [id number]. Topic: General - Other >> Oct 05, 2023 12:42 PM Frederich PARAS wrote: Reason for CRM: tony from [facility] urology she is requesting that pt be taken off of the aspirin for 5 days prior  due to her doing a biopsy. They will fax over a form  to see if it is ok for pt to come off the medication temporarily   [phone] ext [phone] tony

## 2023-10-05 NOTE — Telephone Encounter (Signed)
 I'm fine with this- will keep eye out for form

## 2023-10-06 NOTE — Telephone Encounter (Signed)
 Have not received form for clearance. Left vm for Koren regarding instructions from Dr. Katrinka that it is okay for him to be off aspirin for 5 days.

## 2023-10-07 DIAGNOSIS — G4733 Obstructive sleep apnea (adult) (pediatric): Secondary | ICD-10-CM | POA: Diagnosis not present

## 2023-10-07 DIAGNOSIS — I10 Essential (primary) hypertension: Secondary | ICD-10-CM | POA: Diagnosis not present

## 2023-10-27 ENCOUNTER — Telehealth: Payer: Self-pay | Admitting: Family Medicine

## 2023-10-27 ENCOUNTER — Encounter: Payer: Self-pay | Admitting: Family Medicine

## 2023-10-27 NOTE — Telephone Encounter (Signed)
 This is being handled on separate encounter.   Copied from CRM [id number]. Topic: Clinical - Medication Question >> Oct 27, 2023  1:02 PM Shereese L wrote: Reason for CRM: Patient needs shara that he can stop taking aspirin EC 81 MG tablet5 days before the biopsy and he can't schedule until he gets the serbia.  Oct 6 a request was sent from [facility] and needs to be replied back so that he can find out if he has Cancer. Patient would like a call back regarding this matter >> Oct 27, 2023  4:06 PM Roselie BROCKS wrote: Patient called to speak to [facility] concerning the conversation on Mychart, Patient disconnected while holding for clinic .

## 2023-10-27 NOTE — Telephone Encounter (Signed)
 Left detailed vm for pt per DPR. Made patient aware we called [facility] on 10/05/2023 for instructions for holding medication and authorization.

## 2023-10-27 NOTE — Telephone Encounter (Signed)
 From note 10/05/23  Have not received form for clearance. Left vm for Koren regarding instructions from Dr. Katrinka that it is okay for him to be off aspirin for 5 days.      I had responded yes he could hold and you called them. You may simply want to print this off and fax it to them that he may hold it and let patient know as well

## 2023-10-27 NOTE — Telephone Encounter (Signed)
 We never got a form from [facility].   Copied from CRM [id number]. Topic: Clinical - Medication Question >> Oct 27, 2023  1:02 PM Shereese L wrote: Reason for CRM: Patient needs shara that he can stop taking aspirin EC 81 MG tablet5 days before the biopsy and he can't schedule until he gets the serbia.  Oct 6 a request was sent from [facility] and needs to be replied back so that he can find out if he has Cancer. Patient would like a call back regarding this matter

## 2023-11-09 ENCOUNTER — Telehealth: Payer: Self-pay | Admitting: Family Medicine

## 2023-11-09 NOTE — Telephone Encounter (Signed)
 Will fax again today.    Copied from CRM #[id number]. Topic: Medical Record Request - Provider/Facility Request >> Nov 08, 2023  4:20 PM Ashley R wrote: Reason for CRM: Document still need for aspirin clearance at [facility]. States they have not recd and cannot proceed with verbal confirmation from VM or patient Fax [phone]

## 2023-11-12 ENCOUNTER — Encounter: Payer: Self-pay | Admitting: Family Medicine

## 2023-11-12 ENCOUNTER — Telehealth: Payer: Self-pay | Admitting: Family Medicine

## 2023-11-12 NOTE — Telephone Encounter (Signed)
 Spoke with Andree today and faxed paperwork to her.   Copied from CRM #[id number]. Topic: Medical Record Request - Provider/Facility Request >> Nov 08, 2023  4:20 PM Ashley R wrote: Reason for CRM: Document still need for aspirin clearance at [facility]. States they have not recd and cannot proceed with verbal confirmation from VM or patient Fax [phone] >> Nov 12, 2023  1:52 PM Roselie BROCKS wrote: [facility] calling for clearance letter to be faxed in for patient ,transferred to [facility] at clinic for further assistance

## 2023-11-12 NOTE — Telephone Encounter (Signed)
 Form faxed again to [facility].   Copied from CRM #[id number]. Topic: General - Other >> Nov 12, 2023  1:50 PM Thersia C wrote: Reason for CRM: [facility] called in stated they have not received the fax again would like the form to be faxed on , or would like for the patient to be contacted to get the form and bring it to them .   [phone]

## 2023-11-29 DIAGNOSIS — G4733 Obstructive sleep apnea (adult) (pediatric): Secondary | ICD-10-CM | POA: Diagnosis not present

## 2024-06-07 ENCOUNTER — Encounter: Admitting: Family Medicine

## 2024-08-24 ENCOUNTER — Ambulatory Visit
